# Patient Record
Sex: Male | Born: 1940 | Race: White | Hispanic: No | Marital: Married | Smoking: Former smoker
Health system: Southern US, Community
[De-identification: ages and names within clinical notes are randomized; demographics above are authoritative.]

## PROBLEM LIST (undated history)

## (undated) DIAGNOSIS — R1900 Intra-abdominal and pelvic swelling, mass and lump, unspecified site: Principal | ICD-10-CM

## (undated) DIAGNOSIS — E039 Hypothyroidism, unspecified: Secondary | ICD-10-CM

## (undated) DIAGNOSIS — D45 Polycythemia vera: Principal | ICD-10-CM

## (undated) DIAGNOSIS — R197 Diarrhea, unspecified: Secondary | ICD-10-CM

## (undated) DIAGNOSIS — E119 Type 2 diabetes mellitus without complications: Secondary | ICD-10-CM

## (undated) DIAGNOSIS — I10 Essential (primary) hypertension: Secondary | ICD-10-CM

## (undated) DIAGNOSIS — E785 Hyperlipidemia, unspecified: Secondary | ICD-10-CM

## (undated) DIAGNOSIS — Z9889 Other specified postprocedural states: Secondary | ICD-10-CM

## (undated) DIAGNOSIS — Z950 Presence of cardiac pacemaker: Secondary | ICD-10-CM

## (undated) DIAGNOSIS — Z8679 Personal history of other diseases of the circulatory system: Secondary | ICD-10-CM

## (undated) DIAGNOSIS — I495 Sick sinus syndrome: Secondary | ICD-10-CM

## (undated) DIAGNOSIS — Z955 Presence of coronary angioplasty implant and graft: Secondary | ICD-10-CM

## (undated) DIAGNOSIS — I25810 Atherosclerosis of coronary artery bypass graft(s) without angina pectoris: Secondary | ICD-10-CM

## (undated) HISTORY — DX: Presence of coronary angioplasty implant and graft: Z95.5

## (undated) HISTORY — DX: Polycythemia vera: D45

## (undated) HISTORY — DX: Diarrhea, unspecified: R19.7

## (undated) HISTORY — DX: Sick sinus syndrome: I49.5

## (undated) HISTORY — DX: Intra-abdominal and pelvic swelling, mass and lump, unspecified site: R19.00

## (undated) HISTORY — DX: Hypothyroidism, unspecified: E03.9

## (undated) HISTORY — DX: Other specified postprocedural states: Z98.890

## (undated) HISTORY — DX: Presence of cardiac pacemaker: Z95.0

## (undated) HISTORY — DX: Atherosclerosis of coronary artery bypass graft(s) without angina pectoris: I25.810

## (undated) HISTORY — DX: Personal history of other diseases of the circulatory system: Z86.79

## (undated) HISTORY — DX: Hyperlipidemia, unspecified: E78.5

## (undated) HISTORY — DX: Essential (primary) hypertension: I10

## (undated) HISTORY — DX: Type 2 diabetes mellitus without complications: E11.9

---

## 2000-02-20 DIAGNOSIS — D45 Polycythemia vera: Secondary | ICD-10-CM | POA: Insufficient documentation

## 2000-02-20 HISTORY — DX: Polycythemia vera: D45

## 2000-08-16 ENCOUNTER — Encounter: Payer: Self-pay | Admitting: *Deleted

## 2000-08-16 ENCOUNTER — Inpatient Hospital Stay (HOSPITAL_COMMUNITY): Admission: EM | Admit: 2000-08-16 | Discharge: 2000-08-19 | Payer: Self-pay | Admitting: Emergency Medicine

## 2000-08-16 ENCOUNTER — Encounter: Payer: Self-pay | Admitting: Emergency Medicine

## 2000-08-21 ENCOUNTER — Encounter: Admission: RE | Admit: 2000-08-21 | Discharge: 2000-08-21 | Payer: Self-pay | Admitting: Pediatrics

## 2000-09-23 ENCOUNTER — Ambulatory Visit (HOSPITAL_COMMUNITY): Admission: RE | Admit: 2000-09-23 | Discharge: 2000-09-23 | Payer: Self-pay | Admitting: Oncology

## 2004-06-21 DIAGNOSIS — Z950 Presence of cardiac pacemaker: Secondary | ICD-10-CM

## 2004-06-21 HISTORY — DX: Presence of cardiac pacemaker: Z95.0

## 2005-01-13 ENCOUNTER — Ambulatory Visit: Payer: Self-pay | Admitting: Oncology

## 2005-03-07 ENCOUNTER — Ambulatory Visit: Payer: Self-pay | Admitting: Oncology

## 2005-05-02 ENCOUNTER — Ambulatory Visit: Payer: Self-pay | Admitting: Oncology

## 2005-07-07 ENCOUNTER — Ambulatory Visit: Payer: Self-pay | Admitting: Oncology

## 2005-09-05 ENCOUNTER — Ambulatory Visit: Payer: Self-pay | Admitting: Oncology

## 2005-10-31 ENCOUNTER — Ambulatory Visit: Payer: Self-pay | Admitting: Oncology

## 2006-01-02 ENCOUNTER — Ambulatory Visit: Payer: Self-pay | Admitting: Oncology

## 2006-10-22 DIAGNOSIS — I25810 Atherosclerosis of coronary artery bypass graft(s) without angina pectoris: Secondary | ICD-10-CM

## 2006-10-22 HISTORY — DX: Atherosclerosis of coronary artery bypass graft(s) without angina pectoris: I25.810

## 2011-06-16 ENCOUNTER — Encounter (HOSPITAL_BASED_OUTPATIENT_CLINIC_OR_DEPARTMENT_OTHER): Payer: Medicare Other | Admitting: Oncology

## 2011-06-16 ENCOUNTER — Other Ambulatory Visit: Payer: Self-pay | Admitting: Oncology

## 2011-06-16 DIAGNOSIS — D45 Polycythemia vera: Secondary | ICD-10-CM

## 2011-06-16 DIAGNOSIS — B009 Herpesviral infection, unspecified: Secondary | ICD-10-CM

## 2011-06-16 DIAGNOSIS — I10 Essential (primary) hypertension: Secondary | ICD-10-CM

## 2011-06-16 DIAGNOSIS — Z950 Presence of cardiac pacemaker: Secondary | ICD-10-CM

## 2011-06-16 LAB — RETICULOCYTES: Retic Ct Abs: 92.58 10*3/uL — ABNORMAL HIGH (ref 24.10–77.50)

## 2011-06-16 LAB — CBC WITH DIFFERENTIAL/PLATELET
BASO%: 2.9 % — ABNORMAL HIGH (ref 0.0–2.0)
Basophils Absolute: 0.4 10*3/uL — ABNORMAL HIGH (ref 0.0–0.1)
EOS%: 0.7 % (ref 0.0–7.0)
Eosinophils Absolute: 0.1 10*3/uL (ref 0.0–0.5)
HCT: 34.8 % — ABNORMAL LOW (ref 38.4–49.9)
HGB: 11.1 g/dL — ABNORMAL LOW (ref 13.0–17.1)
LYMPH%: 9.1 % — ABNORMAL LOW (ref 14.0–49.0)
MCH: 34.3 pg — ABNORMAL HIGH (ref 27.2–33.4)
MCHC: 31.9 g/dL — ABNORMAL LOW (ref 32.0–36.0)
MCV: 107.4 fL — ABNORMAL HIGH (ref 79.3–98.0)
MONO#: 0.7 10*3/uL (ref 0.1–0.9)
MONO%: 5.7 % (ref 0.0–14.0)
NEUT#: 10 10*3/uL — ABNORMAL HIGH (ref 1.5–6.5)
NEUT%: 81.6 % — ABNORMAL HIGH (ref 39.0–75.0)
Platelets: 912 10*3/uL — ABNORMAL HIGH (ref 140–400)
RBC: 3.24 10*6/uL — ABNORMAL LOW (ref 4.20–5.82)
RDW: 18 % — ABNORMAL HIGH (ref 11.0–14.6)
WBC: 12.3 10*3/uL — ABNORMAL HIGH (ref 4.0–10.3)
lymph#: 1.1 10*3/uL (ref 0.9–3.3)
nRBC: 2 % — ABNORMAL HIGH (ref 0–0)

## 2011-06-16 LAB — COMPREHENSIVE METABOLIC PANEL
AST: 13 U/L (ref 0–37)
Albumin: 4.4 g/dL (ref 3.5–5.2)
BUN: 27 mg/dL — ABNORMAL HIGH (ref 6–23)
Calcium: 9.2 mg/dL (ref 8.4–10.5)
Chloride: 108 mEq/L (ref 96–112)
Potassium: 4.4 mEq/L (ref 3.5–5.3)

## 2011-06-16 LAB — MORPHOLOGY: PLT EST: INCREASED

## 2011-06-16 LAB — CHCC SMEAR

## 2011-06-17 LAB — IRON AND TIBC: Iron: 29 ug/dL — ABNORMAL LOW (ref 42–165)

## 2011-06-30 ENCOUNTER — Encounter (HOSPITAL_BASED_OUTPATIENT_CLINIC_OR_DEPARTMENT_OTHER): Payer: Medicare Other | Admitting: Oncology

## 2011-06-30 ENCOUNTER — Other Ambulatory Visit: Payer: Self-pay | Admitting: Oncology

## 2011-06-30 DIAGNOSIS — D45 Polycythemia vera: Secondary | ICD-10-CM

## 2011-06-30 DIAGNOSIS — I10 Essential (primary) hypertension: Secondary | ICD-10-CM

## 2011-06-30 DIAGNOSIS — B009 Herpesviral infection, unspecified: Secondary | ICD-10-CM

## 2011-06-30 DIAGNOSIS — Z950 Presence of cardiac pacemaker: Secondary | ICD-10-CM

## 2011-06-30 LAB — MORPHOLOGY: PLT EST: INCREASED

## 2011-06-30 LAB — CBC WITH DIFFERENTIAL/PLATELET
Basophils Absolute: 0.3 10*3/uL — ABNORMAL HIGH (ref 0.0–0.1)
Eosinophils Absolute: 0.1 10*3/uL (ref 0.0–0.5)
HCT: 34.2 % — ABNORMAL LOW (ref 38.4–49.9)
HGB: 11 g/dL — ABNORMAL LOW (ref 13.0–17.1)
MCH: 33.1 pg (ref 27.2–33.4)
MONO#: 0.4 10*3/uL (ref 0.1–0.9)
NEUT#: 4.4 10*3/uL (ref 1.5–6.5)
NEUT%: 69.6 % (ref 39.0–75.0)
WBC: 6.3 10*3/uL (ref 4.0–10.3)
lymph#: 1.1 10*3/uL (ref 0.9–3.3)

## 2011-06-30 LAB — CHCC SMEAR

## 2011-07-14 ENCOUNTER — Other Ambulatory Visit: Payer: Self-pay | Admitting: Oncology

## 2011-07-14 ENCOUNTER — Encounter (HOSPITAL_BASED_OUTPATIENT_CLINIC_OR_DEPARTMENT_OTHER): Payer: Medicare Other | Admitting: Oncology

## 2011-07-14 DIAGNOSIS — B009 Herpesviral infection, unspecified: Secondary | ICD-10-CM

## 2011-07-14 DIAGNOSIS — Z950 Presence of cardiac pacemaker: Secondary | ICD-10-CM

## 2011-07-14 DIAGNOSIS — D45 Polycythemia vera: Secondary | ICD-10-CM

## 2011-07-14 DIAGNOSIS — I10 Essential (primary) hypertension: Secondary | ICD-10-CM

## 2011-07-14 LAB — MORPHOLOGY

## 2011-07-14 LAB — CBC WITH DIFFERENTIAL/PLATELET
Eosinophils Absolute: 0.1 10*3/uL (ref 0.0–0.5)
HCT: 34.2 % — ABNORMAL LOW (ref 38.4–49.9)
LYMPH%: 12.3 % — ABNORMAL LOW (ref 14.0–49.0)
MONO#: 0.9 10*3/uL (ref 0.1–0.9)
NEUT#: 6.6 10*3/uL — ABNORMAL HIGH (ref 1.5–6.5)
NEUT%: 73.6 % (ref 39.0–75.0)
Platelets: 448 10*3/uL — ABNORMAL HIGH (ref 140–400)
RBC: 3.42 10*6/uL — ABNORMAL LOW (ref 4.20–5.82)
WBC: 9 10*3/uL (ref 4.0–10.3)
lymph#: 1.1 10*3/uL (ref 0.9–3.3)
nRBC: 1 % — ABNORMAL HIGH (ref 0–0)

## 2011-07-23 DIAGNOSIS — Z955 Presence of coronary angioplasty implant and graft: Secondary | ICD-10-CM

## 2011-07-23 HISTORY — DX: Presence of coronary angioplasty implant and graft: Z95.5

## 2011-08-18 ENCOUNTER — Other Ambulatory Visit: Payer: Self-pay | Admitting: Oncology

## 2011-08-18 ENCOUNTER — Encounter (HOSPITAL_BASED_OUTPATIENT_CLINIC_OR_DEPARTMENT_OTHER): Payer: Medicare Other | Admitting: Oncology

## 2011-08-18 DIAGNOSIS — Z950 Presence of cardiac pacemaker: Secondary | ICD-10-CM

## 2011-08-18 DIAGNOSIS — D45 Polycythemia vera: Secondary | ICD-10-CM

## 2011-08-18 DIAGNOSIS — I10 Essential (primary) hypertension: Secondary | ICD-10-CM

## 2011-08-18 DIAGNOSIS — B009 Herpesviral infection, unspecified: Secondary | ICD-10-CM

## 2011-08-18 LAB — MORPHOLOGY: PLT EST: ADEQUATE

## 2011-08-18 LAB — CBC WITH DIFFERENTIAL/PLATELET
BASO%: 6.8 % — ABNORMAL HIGH (ref 0.0–2.0)
EOS%: 0.7 % (ref 0.0–7.0)
LYMPH%: 15 % (ref 14.0–49.0)
MCH: 30.7 pg (ref 27.2–33.4)
MCHC: 31.6 g/dL — ABNORMAL LOW (ref 32.0–36.0)
MCV: 97.3 fL (ref 79.3–98.0)
MONO%: 7.2 % (ref 0.0–14.0)
NEUT#: 5.3 10*3/uL (ref 1.5–6.5)
Platelets: 491 10*3/uL — ABNORMAL HIGH (ref 140–400)
RBC: 3.68 10*6/uL — ABNORMAL LOW (ref 4.20–5.82)
RDW: 19.9 % — ABNORMAL HIGH (ref 11.0–14.6)
nRBC: 0 % (ref 0–0)

## 2011-09-15 ENCOUNTER — Other Ambulatory Visit: Payer: Self-pay | Admitting: Oncology

## 2011-09-15 ENCOUNTER — Encounter (HOSPITAL_BASED_OUTPATIENT_CLINIC_OR_DEPARTMENT_OTHER): Payer: Medicare Other | Admitting: Oncology

## 2011-09-15 DIAGNOSIS — B009 Herpesviral infection, unspecified: Secondary | ICD-10-CM

## 2011-09-15 DIAGNOSIS — I10 Essential (primary) hypertension: Secondary | ICD-10-CM

## 2011-09-15 DIAGNOSIS — D45 Polycythemia vera: Secondary | ICD-10-CM

## 2011-09-15 DIAGNOSIS — I2510 Atherosclerotic heart disease of native coronary artery without angina pectoris: Secondary | ICD-10-CM

## 2011-09-15 DIAGNOSIS — D47Z9 Other specified neoplasms of uncertain behavior of lymphoid, hematopoietic and related tissue: Secondary | ICD-10-CM

## 2011-09-15 DIAGNOSIS — Z950 Presence of cardiac pacemaker: Secondary | ICD-10-CM

## 2011-09-15 LAB — COMPREHENSIVE METABOLIC PANEL
Albumin: 4.4 g/dL (ref 3.5–5.2)
Alkaline Phosphatase: 60 U/L (ref 39–117)
BUN: 26 mg/dL — ABNORMAL HIGH (ref 6–23)
Calcium: 9.4 mg/dL (ref 8.4–10.5)
Chloride: 108 mEq/L (ref 96–112)
Glucose, Bld: 101 mg/dL — ABNORMAL HIGH (ref 70–99)
Potassium: 4.5 mEq/L (ref 3.5–5.3)

## 2011-09-15 LAB — CBC WITH DIFFERENTIAL/PLATELET
BASO%: 5 % — ABNORMAL HIGH (ref 0.0–2.0)
EOS%: 0.5 % (ref 0.0–7.0)
HCT: 37.1 % — ABNORMAL LOW (ref 38.4–49.9)
MCH: 30.9 pg (ref 27.2–33.4)
MCHC: 31.5 g/dL — ABNORMAL LOW (ref 32.0–36.0)
MONO#: 0.5 10*3/uL (ref 0.1–0.9)
NEUT%: 74.9 % (ref 39.0–75.0)
RBC: 3.79 10*6/uL — ABNORMAL LOW (ref 4.20–5.82)
WBC: 7.6 10*3/uL (ref 4.0–10.3)
lymph#: 1 10*3/uL (ref 0.9–3.3)
nRBC: 1 % — ABNORMAL HIGH (ref 0–0)

## 2011-09-15 LAB — MORPHOLOGY
PLT EST: INCREASED
White Cell Comments: 9

## 2011-11-06 ENCOUNTER — Telehealth: Payer: Self-pay | Admitting: *Deleted

## 2011-11-06 NOTE — Telephone Encounter (Signed)
Received call from pt's wife req lab order to be faxed for labs scheduled for 11/11/11 at our office to be done at [place] on 11/10/11.  She states he is going for an ablation on the 20th.  Order printed from [facility] & faxed to [phone] with OK for lab 11/10/11 & to fax to [phone].  Will have schedulers cancel 11/11/11 lab.

## 2011-11-10 ENCOUNTER — Telehealth: Payer: Self-pay | Admitting: *Deleted

## 2011-11-10 NOTE — Telephone Encounter (Signed)
LAB ORDER FOR CBC WITH [id number] MORPHOLOGY. IS THIS MORPHOLOGY A SEPARATE TEST? VERBAL ORDER AND READ BACK TO DR.GRANFORTUNA- IT IS NOT A SEPARATE TEST. NOTIFIED LISA. SHE VOICES UNDERSTANDING.

## 2011-11-11 ENCOUNTER — Other Ambulatory Visit: Payer: Medicare Other | Admitting: Lab

## 2011-11-11 DIAGNOSIS — Z8679 Personal history of other diseases of the circulatory system: Secondary | ICD-10-CM

## 2011-11-11 HISTORY — DX: Personal history of other diseases of the circulatory system: Z86.79

## 2011-11-18 ENCOUNTER — Other Ambulatory Visit: Payer: Self-pay | Admitting: *Deleted

## 2011-11-18 ENCOUNTER — Telehealth: Payer: Self-pay | Admitting: *Deleted

## 2011-11-18 ENCOUNTER — Telehealth: Payer: Self-pay | Admitting: Oncology

## 2011-11-18 ENCOUNTER — Encounter: Payer: Self-pay | Admitting: *Deleted

## 2011-11-18 NOTE — Telephone Encounter (Signed)
So - go ahead & schedule something for Jan or Feb 30 minutes - I have about 300 patients who have not been given appointments - need to talk with Judeth Cornfield to see why not.  Dr Reece Agar

## 2011-11-18 NOTE — Telephone Encounter (Signed)
Notified pt that labs from 11/10/11 very good per Dr Cyndie Chime & to stay on same dose of hydrea= 500mg  bid & cont cbc q mo.  Pt. Has lab appt in dec.  POF to scheduler for monthly lab.

## 2011-11-18 NOTE — Telephone Encounter (Signed)
Pt does not have an MD visit

## 2011-11-20 ENCOUNTER — Telehealth: Payer: Self-pay | Admitting: Oncology

## 2011-11-20 NOTE — Telephone Encounter (Signed)
Talked to pt gave her appt every months until February. In February pt will see MD

## 2011-12-08 ENCOUNTER — Other Ambulatory Visit: Payer: Self-pay | Admitting: Oncology

## 2011-12-08 ENCOUNTER — Telehealth: Payer: Self-pay

## 2011-12-08 ENCOUNTER — Other Ambulatory Visit (HOSPITAL_BASED_OUTPATIENT_CLINIC_OR_DEPARTMENT_OTHER): Payer: Medicare Other | Admitting: Lab

## 2011-12-08 DIAGNOSIS — D45 Polycythemia vera: Secondary | ICD-10-CM

## 2011-12-08 LAB — MORPHOLOGY
RBC Comments: 2
White Cell Comments: 6

## 2011-12-08 LAB — CBC WITH DIFFERENTIAL/PLATELET
Eosinophils Absolute: 0 10*3/uL (ref 0.0–0.5)
MONO#: 0.4 10*3/uL (ref 0.1–0.9)
NEUT#: 5.4 10*3/uL (ref 1.5–6.5)
RBC: 4.09 10*6/uL — ABNORMAL LOW (ref 4.20–5.82)
RDW: 18.8 % — ABNORMAL HIGH (ref 11.0–14.6)
WBC: 6.8 10*3/uL (ref 4.0–10.3)
lymph#: 0.8 10*3/uL — ABNORMAL LOW (ref 0.9–3.3)

## 2011-12-08 LAB — COMPREHENSIVE METABOLIC PANEL
AST: 17 U/L (ref 0–37)
Alkaline Phosphatase: 64 U/L (ref 39–117)
BUN: 23 mg/dL (ref 6–23)
Creatinine, Ser: 1.23 mg/dL (ref 0.50–1.35)
Total Bilirubin: 0.8 mg/dL (ref 0.3–1.2)

## 2011-12-08 NOTE — Telephone Encounter (Signed)
Message copied by Albertha Ghee on Mon Dec 08, 2011  4:50 PM ------      Message from: Levert Feinstein      Created: Mon Dec 08, 2011  1:53 PM       Call patient - counts good - stay on current dose of Hydrea  CBC, dii Q month - can do every other month close to home if he prefers during winter

## 2011-12-08 NOTE — Telephone Encounter (Signed)
Pt's spouse notified by phone of lab results per Dr Cyndie Chime.  Pt to remain on same Hydrea dose which spouse confirms to be 500mg  bid.  dph

## 2012-01-06 ENCOUNTER — Other Ambulatory Visit: Payer: Self-pay | Admitting: Oncology

## 2012-01-06 ENCOUNTER — Telehealth: Payer: Self-pay | Admitting: *Deleted

## 2012-01-06 ENCOUNTER — Other Ambulatory Visit (HOSPITAL_BASED_OUTPATIENT_CLINIC_OR_DEPARTMENT_OTHER): Payer: Medicare Other | Admitting: Lab

## 2012-01-06 DIAGNOSIS — D45 Polycythemia vera: Secondary | ICD-10-CM

## 2012-01-06 LAB — CBC WITH DIFFERENTIAL/PLATELET
Eosinophils Absolute: 0 10*3/uL (ref 0.0–0.5)
HCT: 40.7 % (ref 38.4–49.9)
LYMPH%: 11.6 % — ABNORMAL LOW (ref 14.0–49.0)
MONO#: 0.2 10*3/uL (ref 0.1–0.9)
NEUT#: 5 10*3/uL (ref 1.5–6.5)
NEUT%: 82.7 % — ABNORMAL HIGH (ref 39.0–75.0)
Platelets: 337 10*3/uL (ref 140–400)
WBC: 6.1 10*3/uL (ref 4.0–10.3)

## 2012-01-06 LAB — MORPHOLOGY: PLT EST: ADEQUATE

## 2012-01-06 NOTE — Telephone Encounter (Signed)
Message left earlier for pt to return call & wife called back & notified her that Dr Cyndie Chime states labs good & to stay on same dose of hydrea.  She reports that pt is on bactrim as of yest. & xalrelto since ablation surg. 11/11/11.

## 2012-02-03 ENCOUNTER — Other Ambulatory Visit: Payer: Medicare Other | Admitting: Lab

## 2012-02-03 ENCOUNTER — Ambulatory Visit (HOSPITAL_BASED_OUTPATIENT_CLINIC_OR_DEPARTMENT_OTHER): Payer: Medicare Other | Admitting: Oncology

## 2012-02-03 ENCOUNTER — Telehealth: Payer: Self-pay | Admitting: Oncology

## 2012-02-03 ENCOUNTER — Encounter: Payer: Self-pay | Admitting: Oncology

## 2012-02-03 VITALS — BP 125/80 | HR 97 | Temp 97.0°F | Wt 211.5 lb

## 2012-02-03 DIAGNOSIS — I10 Essential (primary) hypertension: Secondary | ICD-10-CM

## 2012-02-03 DIAGNOSIS — E119 Type 2 diabetes mellitus without complications: Secondary | ICD-10-CM

## 2012-02-03 DIAGNOSIS — E039 Hypothyroidism, unspecified: Secondary | ICD-10-CM

## 2012-02-03 DIAGNOSIS — D45 Polycythemia vera: Secondary | ICD-10-CM

## 2012-02-03 DIAGNOSIS — Z8679 Personal history of other diseases of the circulatory system: Secondary | ICD-10-CM

## 2012-02-03 DIAGNOSIS — E785 Hyperlipidemia, unspecified: Secondary | ICD-10-CM

## 2012-02-03 DIAGNOSIS — I25810 Atherosclerosis of coronary artery bypass graft(s) without angina pectoris: Secondary | ICD-10-CM

## 2012-02-03 DIAGNOSIS — Z950 Presence of cardiac pacemaker: Secondary | ICD-10-CM

## 2012-02-03 DIAGNOSIS — Z955 Presence of coronary angioplasty implant and graft: Secondary | ICD-10-CM

## 2012-02-03 DIAGNOSIS — I495 Sick sinus syndrome: Secondary | ICD-10-CM

## 2012-02-03 DIAGNOSIS — I669 Occlusion and stenosis of unspecified cerebral artery: Secondary | ICD-10-CM | POA: Insufficient documentation

## 2012-02-03 HISTORY — DX: Hypothyroidism, unspecified: E03.9

## 2012-02-03 HISTORY — DX: Hyperlipidemia, unspecified: E78.5

## 2012-02-03 HISTORY — DX: Essential (primary) hypertension: I10

## 2012-02-03 HISTORY — DX: Sick sinus syndrome: I49.5

## 2012-02-03 HISTORY — DX: Type 2 diabetes mellitus without complications: E11.9

## 2012-02-03 LAB — CBC WITH DIFFERENTIAL/PLATELET
BASO%: 3.9 % — ABNORMAL HIGH (ref 0.0–2.0)
EOS%: 0.6 % (ref 0.0–7.0)
Eosinophils Absolute: 0 10*3/uL (ref 0.0–0.5)
MCHC: 33.3 g/dL (ref 32.0–36.0)
MCV: 103.6 fL — ABNORMAL HIGH (ref 79.3–98.0)
MONO%: 4.7 % (ref 0.0–14.0)
NEUT#: 3.6 10*3/uL (ref 1.5–6.5)
RBC: 3.36 10*6/uL — ABNORMAL LOW (ref 4.20–5.82)
RDW: 17.2 % — ABNORMAL HIGH (ref 11.0–14.6)
WBC: 4.9 10*3/uL (ref 4.0–10.3)
nRBC: 0 % (ref 0–0)

## 2012-02-03 LAB — MORPHOLOGY: White Cell Comments: 12

## 2012-02-03 NOTE — Progress Notes (Signed)
Hematology and Oncology Follow Up Visit  David Sullivan [id number] 05-19-1941 71 y.o. 02/03/2012 8:52 PM   Principle Diagnosis: Encounter Diagnoses  Name Primary?  . Polycythemia vera Yes  . Cerebral embolism   . CAD (coronary artery disease) of artery bypass graft   . Sick sinus syndrome with tachycardia   . Cardiac pacemaker   . Stented coronary artery   . Hypothyroidism   . DM type 2 (diabetes mellitus, type 2)   . History of radiofrequency ablation for complex left atrial arrhythmia   . Hyperlipidemia   . 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 He has been followed in this office since March of 2001 when he was diagnosed with polycythemia vera. He was started on a phlebotomy program. 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 He remains on Hydrea at this time. He was lost to followup in this office and was seeing a hematologist closer to his home in [place] until June of 2012 when he asked to be reevaluated. He had developed a moderate degree of anemia with hemoglobin down to 11 g which correlated with a recent dose increase in his Hydrea. He had profound fatigue and unstable angina and I really felt that the fatigue was disproportionate to the fall in his hemoglobin and more closely related to progressive cardiac symptoms. In fact, do to unstable angina he was admitted to the hospital in [place] and cardiac catheterization showed complete occlusion of his right coronary artery. A drug eluting stent was placed. He was started on aspirin and Plavix. He has known coronary artery disease and had bypass surgery in 2007. He has sick sinus syndrome and had a permanent pacemaker placed in July 2005. He continues to have problems with atrial arrhythmias both  atrial flutter and atrial fibrillation. He recently had a radiofrequency ablation procedure done to control the atrial flutter. He was initially put on Coumadin but developed a huge hematoma on his right hand. Coumadin was stopped and he was put on is Xarelto. He is still getting intermittent palpitations but no longer getting chest pain since recent stent placed.  On a brighter note, I have been able to get him back on a stable Hydrea dose 1000 mg daily. He has had a steady improvement in his some CBC with a rise in his hemoglobin up to 13.7 g by a 12/08/2011 and a fall in his platelet count down to 408,000 at that time with subsequent fall down to today's value of 261,000. However now his hemoglobin is falling again and is 11.6 g and white blood count down to 4900 from 6800 in December.  He reports no new neurologic symptoms. He still gets occasional headaches. Nothing severe or progressive. No change in vision. No focal weakness.  Medications: reviewed  Allergies: No Known Allergies  Review of Systems: Constitutional:   He still fatigues easily Respiratory: No cough or dyspnea Cardiovascular:  Currently no chest pain. Intermittent palpitations. Gastrointestinal: No change in bowel habit. No hematochezia or melena. Genito-Urinary: No urinary tract symptoms. Musculoskeletal: Neurologic: See above Skin: Remaining ROS negative.  Physical Exam: Blood pressure 125/80, pulse 97, temperature 97 F (36.1 C), temperature source Oral, weight 211 lb 8 oz (95.936 kg). Wt Readings from Last 3 Encounters:  02/03/12 211  lb 8 oz (95.936 kg)     General appearance: Well-nourished Caucasian man HENNT: Pharynx no erythema or exudate Lymph nodes: No lymphadenopathy Breasts: Lungs: Clear to auscultation resonant to percussion Heart: No murmur or gallop regular rhythm Abdomen: Soft nontender no mass no organomegaly Extremities: No edema no calf tenderness Vascular: No cyanosis Neurologic: Alert and  oriented cranial nerves intact pupils equal round reactive to light optic discs sharp no hemorrhages or exudate no retinal vein distention motor strength is 5 over 5 reflexes 2+ symmetric upper body coordination normal gait normal Skin: No rash or ecchymosis  Lab Results: Lab Results  Component Value Date   WBC 4.9 02/03/2012   HGB 11.6* 02/03/2012   HCT 34.8* 02/03/2012   MCV 103.6* 02/03/2012   PLT 261 02/03/2012     Chemistry      Component Value Date/Time   NA 142 12/08/2011 1329   K 4.3 12/08/2011 1329   CL 105 12/08/2011 1329   CO2 24 12/08/2011 1329   BUN 23 12/08/2011 1329   CREATININE 1.23 12/08/2011 1329      Component Value Date/Time   CALCIUM 9.6 12/08/2011 1329   ALKPHOS 64 12/08/2011 1329   AST 17 12/08/2011 1329   ALT 9 12/08/2011 1329   BILITOT 0.8 12/08/2011 1329       Impression and Plan: #1. Polycythemia vera. Hemoglobin and white count now falling at the expense of his platelets. I'm going to make a minor dose adjustment in his Hydrea down to 500 mg on Mondays and Thursdays with 1000 mg other days of the week. Continue to check CBCs monthly basis and make further adjustments as indicated. #2. History of embolic stroke likely related to #1. In view of coronary artery disease and atrial arrhythmias he is now well covered with aspirin, Plavix, and is Xarelto. #3. Coronary artery disease status post bypass surgery November 2007. #4. Sick sinus syndrome status post permanent pacemaker July 2005. #5. Chronic atrial arrhythmias now status post radiofrequency ablation procedure January 2013 #6. Reocclusion of the right current [facility] artery requiring urgent drug eluting stent placement in August 2012. #7. Type 2 diabetes #8. Essential hypertension #9. Hyperlipidemia #10. Hypothyroid on replacement   CC:. Dr Augustina Mood; Wallace Cullens; Daryel November   Levert Feinstein, MD 2/12/20138:52 PM

## 2012-02-03 NOTE — Telephone Encounter (Signed)
appts made and printed for 4/5/6 2014   aom

## 2012-03-02 ENCOUNTER — Other Ambulatory Visit (HOSPITAL_BASED_OUTPATIENT_CLINIC_OR_DEPARTMENT_OTHER): Payer: Medicare Other | Admitting: Lab

## 2012-03-02 DIAGNOSIS — D45 Polycythemia vera: Secondary | ICD-10-CM

## 2012-03-02 LAB — CBC WITH DIFFERENTIAL/PLATELET
Eosinophils Absolute: 0 10*3/uL (ref 0.0–0.5)
HCT: 30.5 % — ABNORMAL LOW (ref 38.4–49.9)
LYMPH%: 18.6 % (ref 14.0–49.0)
MCHC: 32.8 g/dL (ref 32.0–36.0)
MCV: 106.6 fL — ABNORMAL HIGH (ref 79.3–98.0)
MONO%: 6.8 % (ref 0.0–14.0)
NEUT#: 3.3 10*3/uL (ref 1.5–6.5)
NEUT%: 68.7 % (ref 39.0–75.0)
Platelets: 255 10*3/uL (ref 140–400)
RBC: 2.86 10*6/uL — ABNORMAL LOW (ref 4.20–5.82)
nRBC: 1 % — ABNORMAL HIGH (ref 0–0)

## 2012-03-03 ENCOUNTER — Telehealth: Payer: Self-pay | Admitting: *Deleted

## 2012-03-03 ENCOUNTER — Other Ambulatory Visit: Payer: Self-pay | Admitting: *Deleted

## 2012-03-03 DIAGNOSIS — D45 Polycythemia vera: Secondary | ICD-10-CM

## 2012-03-03 NOTE — Telephone Encounter (Signed)
Message copied by Sabino Snipes on Wed Mar 03, 2012 12:17 PM ------      Message from: Levert Feinstein      Created: Tue Mar 02, 2012  5:01 PM       Call pt:  Hemoglobin 10; make further decrease in Hydrea to 500 mg Mon/wed/fri  1000 mg other days  Check CBC 2 wks

## 2012-03-03 NOTE — Telephone Encounter (Signed)
Wife notified per Dr. Cyndie Chime that he would only be concerned if pt was having any active bleeding & wants to know if he has had black stools.  Ms. Grieder asked the pt & he reports no black stools.  Suggested they could have PCP check quaic card.  She appreciated that her question was answered.

## 2012-03-03 NOTE — Telephone Encounter (Signed)
Pt's wife notified of lab results & instructions given to decrease hydrea to 500 mg M W F & 1000 mg other days & recheck labs in 2 wks.  Order sent to schedulers.  Ms. Terpening wonders if pt's hgb dropping down has anything to do with pt being on xalrelto or plavix.  She thinks the downward trend started about the same time as he started xalrelto.  Note to Dr. Cyndie Chime.

## 2012-03-17 ENCOUNTER — Telehealth: Payer: Self-pay | Admitting: Oncology

## 2012-03-17 ENCOUNTER — Other Ambulatory Visit: Payer: Self-pay | Admitting: *Deleted

## 2012-03-17 NOTE — Telephone Encounter (Signed)
S/w pt today re appt for 4/1 @ 1:45 pm. Per myrtle other lab appts left as scheduled for now.

## 2012-03-22 ENCOUNTER — Other Ambulatory Visit (HOSPITAL_BASED_OUTPATIENT_CLINIC_OR_DEPARTMENT_OTHER): Payer: Medicare Other | Admitting: Lab

## 2012-03-22 ENCOUNTER — Other Ambulatory Visit: Payer: Self-pay | Admitting: Oncology

## 2012-03-22 DIAGNOSIS — D45 Polycythemia vera: Secondary | ICD-10-CM

## 2012-03-22 LAB — CBC WITH DIFFERENTIAL/PLATELET
BASO%: 6.3 % — ABNORMAL HIGH (ref 0.0–2.0)
Basophils Absolute: 0.3 10*3/uL — ABNORMAL HIGH (ref 0.0–0.1)
EOS%: 0.6 % (ref 0.0–7.0)
HCT: 30.8 % — ABNORMAL LOW (ref 38.4–49.9)
LYMPH%: 13.5 % — ABNORMAL LOW (ref 14.0–49.0)
MCH: 37.3 pg — ABNORMAL HIGH (ref 27.2–33.4)
MCHC: 33.3 g/dL (ref 32.0–36.0)
MCV: 112.1 fL — ABNORMAL HIGH (ref 79.3–98.0)
MONO%: 5.8 % (ref 0.0–14.0)
NEUT%: 73.8 % (ref 39.0–75.0)
Platelets: 290 10*3/uL (ref 140–400)
lymph#: 0.7 10*3/uL — ABNORMAL LOW (ref 0.9–3.3)

## 2012-03-23 ENCOUNTER — Telehealth: Payer: Self-pay

## 2012-03-23 ENCOUNTER — Other Ambulatory Visit: Payer: Self-pay

## 2012-03-23 NOTE — Telephone Encounter (Addendum)
Pt's wife notified of lab results -   Per Ms Noyce pt is already taking Hydrea this way; verified by Lendell Caprice, RN last note.    Per Dr Cyndie Chime - take 1000mg  MWF & 500mg  all other days. Ms less woolsey understanding & reads back instructions.   Pt's lab rescheduled from next week 4/9 for 2 weeks 4/16.  dph ------      Message from: Levert Feinstein      Created: Mon Mar 22, 2012  4:39 PM       Call patient - labs good - I would like to make further dose decrease in Hydrea. Currently 1000 mg daily except 500 mg Mon & Thurs.      Change to 1000 mg daily except 500 mg Mon/Wed/Fri  Check CBC 2 wks

## 2012-03-23 NOTE — Telephone Encounter (Signed)
Message copied by Albertha Ghee on Tue Mar 23, 2012  3:32 PM ------      Message from: Levert Feinstein      Created: Mon Mar 22, 2012  4:39 PM       Call patient - labs good - I would like to make further dose decrease in Hydrea. Currently 1000 mg daily except 500 mg Mon & Thurs.      Change to 1000 mg daily except 500 mg Mon/Wed/Fri  Check CBC 2 wks

## 2012-03-24 ENCOUNTER — Telehealth: Payer: Self-pay | Admitting: Oncology

## 2012-03-24 NOTE — Telephone Encounter (Signed)
S/w the pt's wife and she is aware of the r/s lab appt from 03/30/2012 to 04/06/2012@12 :noon

## 2012-03-30 ENCOUNTER — Other Ambulatory Visit: Payer: Medicare Other | Admitting: Lab

## 2012-04-06 ENCOUNTER — Telehealth: Payer: Self-pay | Admitting: *Deleted

## 2012-04-06 ENCOUNTER — Other Ambulatory Visit (HOSPITAL_BASED_OUTPATIENT_CLINIC_OR_DEPARTMENT_OTHER): Payer: Medicare Other

## 2012-04-06 DIAGNOSIS — D45 Polycythemia vera: Secondary | ICD-10-CM

## 2012-04-06 LAB — CBC WITH DIFFERENTIAL/PLATELET
MCH: 35.2 pg — ABNORMAL HIGH (ref 27.2–33.4)
MCHC: 32.5 g/dL (ref 32.0–36.0)
MCV: 108.5 fL — ABNORMAL HIGH (ref 79.3–98.0)
RBC: 3.18 10*6/uL — ABNORMAL LOW (ref 4.20–5.82)
RDW: 15.7 % — ABNORMAL HIGH (ref 11.0–14.6)

## 2012-04-06 LAB — MANUAL DIFFERENTIAL
ANC (CHCC manual diff): 4.8 10*3/uL (ref 1.5–6.5)
Basophil: 6 % — ABNORMAL HIGH (ref 0–2)
Blasts: 0 % (ref 0–0)
EOS: 1 % (ref 0–7)
Metamyelocytes: 0 % (ref 0–0)
PLT EST: ADEQUATE
PROMYELO: 0 % (ref 0–0)

## 2012-04-06 NOTE — Telephone Encounter (Signed)
Notified pt per Dr. Cyndie Chime to stay on same dose of hydrea = 500 mg M W F & 1000mg  other days & return in 2 wks for labs.  He repots that his cardiologist took him off the xalrelto & put him back on coumadin due to feeling dizzy, h/a, & unsteady on his feet at times.  He will get his PT/INR checked thurs.

## 2012-04-07 ENCOUNTER — Telehealth: Payer: Self-pay | Admitting: Oncology

## 2012-04-07 NOTE — Telephone Encounter (Signed)
Added lb appt for 4/30. S/w wife re appt. Other appts remain the same.

## 2012-04-20 ENCOUNTER — Telehealth: Payer: Self-pay | Admitting: *Deleted

## 2012-04-20 ENCOUNTER — Other Ambulatory Visit: Payer: Self-pay | Admitting: Oncology

## 2012-04-20 ENCOUNTER — Other Ambulatory Visit (HOSPITAL_BASED_OUTPATIENT_CLINIC_OR_DEPARTMENT_OTHER): Payer: Medicare Other | Admitting: Lab

## 2012-04-20 DIAGNOSIS — D45 Polycythemia vera: Secondary | ICD-10-CM

## 2012-04-20 LAB — CBC WITH DIFFERENTIAL/PLATELET
EOS%: 0.7 % (ref 0.0–7.0)
Eosinophils Absolute: 0 10*3/uL (ref 0.0–0.5)
MCH: 34.9 pg — ABNORMAL HIGH (ref 27.2–33.4)
MCV: 106.5 fL — ABNORMAL HIGH (ref 79.3–98.0)
MONO%: 6.4 % (ref 0.0–14.0)
NEUT#: 4.4 10*3/uL (ref 1.5–6.5)
RBC: 3.24 10*6/uL — ABNORMAL LOW (ref 4.20–5.82)
RDW: 15.2 % — ABNORMAL HIGH (ref 11.0–14.6)
lymph#: 0.9 10*3/uL (ref 0.9–3.3)
nRBC: 2 % — ABNORMAL HIGH (ref 0–0)

## 2012-04-20 LAB — TECHNOLOGIST REVIEW

## 2012-04-20 NOTE — Telephone Encounter (Signed)
Notified pt's wife to have pt stay on same dose of hydrea of which my 04/06/12 was incorrect.  The dose he is taking is 1000mg  MWF & 500mg  other days.  He will cont. this dose & repeat lab in 1 mo per Dr. Cyndie Chime.  Order sent to scheduler.

## 2012-04-22 ENCOUNTER — Other Ambulatory Visit: Payer: Self-pay | Admitting: *Deleted

## 2012-04-22 ENCOUNTER — Telehealth: Payer: Self-pay | Admitting: Oncology

## 2012-04-22 NOTE — Telephone Encounter (Signed)
Talked to pt's wife gave her appt date for may 29th and June lab and MD

## 2012-04-27 ENCOUNTER — Other Ambulatory Visit: Payer: Medicare Other | Admitting: Lab

## 2012-05-04 ENCOUNTER — Other Ambulatory Visit: Payer: Medicare Other | Admitting: Lab

## 2012-05-18 ENCOUNTER — Telehealth: Payer: Self-pay

## 2012-05-18 NOTE — Telephone Encounter (Signed)
Received call from pt's wife reporting David Sullivan was hospitalized in [place] last week from Tuesday 5/21 - Friday 5/24 re: chest pain.   Pt is scheduled for lab only here tomorrow 5/29.  Ms Scogin faxed over pt's labs from 5/24.  Questions if pt needs to keep lab appt here tomorrow.    Note to Dr Cyndie Chime. dph

## 2012-05-18 NOTE — Telephone Encounter (Signed)
Ms Eggert notified by phone - Dr Cyndie Chime pleased with pt's counts.  Continue current dose of Hydrea - 1000mg  MWF & 500mg  all other days.  appt for tomorrow cancelled per Dr Cyndie Chime.  Aware of appts on 6/18. dph

## 2012-05-19 ENCOUNTER — Other Ambulatory Visit: Payer: Medicare Other | Admitting: Lab

## 2012-05-25 ENCOUNTER — Other Ambulatory Visit: Payer: Medicare Other | Admitting: Lab

## 2012-06-08 ENCOUNTER — Telehealth: Payer: Self-pay | Admitting: Oncology

## 2012-06-08 ENCOUNTER — Ambulatory Visit (HOSPITAL_BASED_OUTPATIENT_CLINIC_OR_DEPARTMENT_OTHER): Payer: Medicare Other | Admitting: Oncology

## 2012-06-08 ENCOUNTER — Other Ambulatory Visit (HOSPITAL_BASED_OUTPATIENT_CLINIC_OR_DEPARTMENT_OTHER): Payer: Medicare Other | Admitting: Lab

## 2012-06-08 VITALS — BP 138/81 | HR 79 | Temp 97.3°F | Ht 73.0 in | Wt 204.1 lb

## 2012-06-08 DIAGNOSIS — D45 Polycythemia vera: Secondary | ICD-10-CM

## 2012-06-08 DIAGNOSIS — I2510 Atherosclerotic heart disease of native coronary artery without angina pectoris: Secondary | ICD-10-CM

## 2012-06-08 DIAGNOSIS — I4891 Unspecified atrial fibrillation: Secondary | ICD-10-CM

## 2012-06-08 LAB — CBC & DIFF AND RETIC
Basophils Absolute: 0.3 10*3/uL — ABNORMAL HIGH (ref 0.0–0.1)
EOS%: 1 % (ref 0.0–7.0)
HCT: 40.5 % (ref 38.4–49.9)
HGB: 13 g/dL (ref 13.0–17.1)
MCH: 31.5 pg (ref 27.2–33.4)
MCV: 98.1 fL — ABNORMAL HIGH (ref 79.3–98.0)
MONO%: 7.6 % (ref 0.0–14.0)
NEUT%: 72 % (ref 39.0–75.0)
Platelets: 301 10*3/uL (ref 140–400)
Retic Ct Abs: 119.36 10*3/uL — ABNORMAL HIGH (ref 34.80–93.90)

## 2012-06-08 LAB — MORPHOLOGY: PLT EST: ADEQUATE

## 2012-06-08 LAB — COMPREHENSIVE METABOLIC PANEL
ALT: 8 U/L (ref 0–53)
AST: 13 U/L (ref 0–37)
Creatinine, Ser: 1.22 mg/dL (ref 0.50–1.35)
Total Bilirubin: 0.8 mg/dL (ref 0.3–1.2)

## 2012-06-08 NOTE — Progress Notes (Signed)
Hematology and Oncology Follow Up Visit  David Sullivan [id number] 08-17-1941 71 y.o. 06/08/2012 2:05 PM   Principle Diagnosis: Encounter Diagnosis  Name Primary?  . Polycythemia rubra vera Yes     Interim History:  Followup visit for this retired 71 year old man Mr. with long-standing polycythemia vera. Initial diagnosis March 2001. Initial phlebotomy program. 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 Blood counts were stable until approximately May of 2012 when blood counts fell to much lower than his baseline do to a recent dose increase by another hematologist. He reestablish with our office in June 2012. And around the same time he had an acute decompensation of his cardiac status. He developed dyspnea and fatigue disproportionate to his degree of anemia. He then developed progressive unstable angina. He was already status post coronary bypass surgery in 2007. Diagnosed with sick sinus syndrome and had a permanent pacemaker placed prior to that in July 2005. In June 2012 advised immediate reevaluation by his cardiologist. He was hospitalized and found to have occlusion of his right current hairy artery and a drug eluting stent was placed. Due to subsequent no problems with recurrent atrial arrhythmias he had a radiofrequency ablation procedure done. He was started on Coumadin. He developed a large hematoma on his right hand. Coumadin was stopped and he was put on Xarelto. He tells me today that now the Xarelto was stopped and he is back on Coumadin. On a brighter note, I have been able to get his blood counts stable again by titrating his Hydrea dose. He is taking 1000 mg Mondays Wednesdays and Fridays and 500 mg on the other days of the week. Hemoglobin today is 13 g which is the best it has been in a year. White count is stable at 5900 and platelets stable at 301,000.  He tells me he was back in the hospital again in may  4 recurrent chest pain. Cardiac catheterization was done. He did not have any new areas of stenosis and was treated medically. Myocardial infarction was ruled out. He has had no further chest pain since that admission.   Medications: reviewed  Allergies: No Known Allergies  Review of Systems: Constitutional:    chronic fatigue Respiratory: dyspnea on exertion  Cardiovascular:   see above  Gastrointestinal: no change in bowel but  Genito-Urinary:  no urinary tract symptoms  Musculoskeletal: No muscle or bone pain Neurologic: no headache or change in vision  Skin: no rash or ecchymosis  Remaining ROS negative.  Physical Exam: Blood pressure 138/81, pulse 79, temperature 97.3 F (36.3 C), temperature source Oral, height 6\' 1"  (1.854 m), weight 204 lb 1.6 oz (92.579 kg). Wt Readings from Last 3 Encounters:  06/08/12 204 lb 1.6 oz (92.579 kg)  02/03/12 211 lb 8 oz (95.936 kg)     General appearance:  well-nourished Caucasian man  HENNT:  pharynx no erythema or exudate Lymph nodes:  no adenopathy  Breasts: Lungs: clear to auscultation resonant to percussion  Heart: regular rhythm no murmur or gallop  Abdomen: soft nontender spleen is enlarged about 10 cm below left costal margin Extremities: no edema no calf tenderness  Vascular: No cyanosis Neurologic:mental status intact, cranial nerves grossly normal, pupils equal round reactive to light, optic disc sharp on the left not well visualized on the right due to patient motion, motor strength is 5 over 5, reflexes 1+ symmetric. Upper body coordination normal.  Skin: no  rash or ecchymosis   Lab Results: Lab Results  Component Value Date   WBC 5.9 06/08/2012   HGB 13.0 06/08/2012   HCT 40.5 06/08/2012   MCV 98.1* 06/08/2012   PLT 301 06/08/2012     Chemistry      Component Value Date/Time   NA 142 12/08/2011 1329   K 4.3 12/08/2011 1329   CL 105 12/08/2011 1329   CO2 24 12/08/2011 1329   BUN 23 12/08/2011 1329   CREATININE 1.23  12/08/2011 1329      Component Value Date/Time   CALCIUM 9.6 12/08/2011 1329   ALKPHOS 64 12/08/2011 1329   AST 17 12/08/2011 1329   ALT 9 12/08/2011 1329   BILITOT 0.8 12/08/2011 1329       Radiological Studies: No results found.  Impression and Plan:  #1. Polycythemia vera. Plan: Continue current dose of Hydrea. Counts are now stable and we can go back to every other month laboratory testing.  #2. Advanced coronary artery disease , status post MI, status post coronary bypass surgery, status post drug eluting coronary stent placement.  #3. Atrial arrhythmias-atrial fibrillation and atrial flutter. Status post radiofrequency ablation procedure. He is on chronic anticoagulation now back on Coumadin.  #4. Type 2 diabetes.  #5. Essential hypertension.  #6. Hyperlipidemia.  #7. Hypothyroid on replacement.  #8. History of an embolic stroke  #9. History of sick sinus syndrome status post permanent pacemaker implant     CC:.    Levert Feinstein, MD 6/18/20132:05 PM

## 2012-06-08 NOTE — Telephone Encounter (Signed)
gve the pt his aug,oct and dec 2013 appt calendars

## 2012-06-29 ENCOUNTER — Other Ambulatory Visit: Payer: Medicare Other | Admitting: Lab

## 2012-08-03 ENCOUNTER — Other Ambulatory Visit: Payer: Medicare Other | Admitting: Lab

## 2012-08-20 ENCOUNTER — Telehealth: Payer: Self-pay | Admitting: *Deleted

## 2012-08-20 NOTE — Telephone Encounter (Signed)
Received call from pt's wife stating that Zeev had a cardiac cath done recently & had problems & has a fistula.  He had a CT done to get a better look at things & saw vascular MD yest & was told that the CT showed a soft tissue mass-like irregular lesion in his upper abdomen anterior to celiac axis, adjacent to transverse colon & anterior/inferior to pancreas-not attached to colon or pancreas & not readily accessible for percutaneous biopsy & probable lymph node complex.  She reports that the MD they saw said to see Dr Cyndie Chime when they informed him that they were seeing him. Encouraged her to have a copy of report sent to [place].  Dr. Cyndie Chime notified.  She reports that pt's surgeon was killed in a plane crash yest & they saw another MD.

## 2012-09-23 ENCOUNTER — Telehealth: Payer: Self-pay | Admitting: *Deleted

## 2012-09-23 NOTE — Telephone Encounter (Signed)
Called and spoke with pt wife Darel Hong), per Dr. Cyndie Chime, counts look good and stay on current dose of Hydrea; re-schedule when pt gets back on his feet.  Pt wife verbalized understanding and confirmed pt should be able to make it to appt set up for 12/06/12.

## 2012-09-28 ENCOUNTER — Other Ambulatory Visit (HOSPITAL_BASED_OUTPATIENT_CLINIC_OR_DEPARTMENT_OTHER): Payer: Medicare Other

## 2012-09-28 DIAGNOSIS — D45 Polycythemia vera: Secondary | ICD-10-CM

## 2012-09-28 LAB — COMPREHENSIVE METABOLIC PANEL (CC13)
AST: 15 U/L (ref 5–34)
Albumin: 3.9 g/dL (ref 3.5–5.0)
Alkaline Phosphatase: 94 U/L (ref 40–150)
Potassium: 4.1 mEq/L (ref 3.5–5.1)
Sodium: 142 mEq/L (ref 136–145)
Total Bilirubin: 1 mg/dL (ref 0.20–1.20)
Total Protein: 6.4 g/dL (ref 6.4–8.3)

## 2012-09-28 LAB — CBC WITH DIFFERENTIAL/PLATELET
Basophils Absolute: 0.2 10*3/uL — ABNORMAL HIGH (ref 0.0–0.1)
Eosinophils Absolute: 0 10*3/uL (ref 0.0–0.5)
HCT: 35 % — ABNORMAL LOW (ref 38.4–49.9)
HGB: 11.2 g/dL — ABNORMAL LOW (ref 13.0–17.1)
LYMPH%: 11.8 % — ABNORMAL LOW (ref 14.0–49.0)
MCV: 101.7 fL — ABNORMAL HIGH (ref 79.3–98.0)
MONO%: 4.2 % (ref 0.0–14.0)
NEUT#: 4.6 10*3/uL (ref 1.5–6.5)
NEUT%: 79.5 % — ABNORMAL HIGH (ref 39.0–75.0)
Platelets: 408 10*3/uL — ABNORMAL HIGH (ref 140–400)
RBC: 3.44 10*6/uL — ABNORMAL LOW (ref 4.20–5.82)

## 2012-09-28 LAB — MORPHOLOGY: PLT EST: ADEQUATE

## 2012-09-29 ENCOUNTER — Telehealth: Payer: Self-pay | Admitting: *Deleted

## 2012-09-29 NOTE — Telephone Encounter (Signed)
Called patient.  Let him know that labs are stable.  He is to stay on his same dose of hydrea.- which is 1000mg   Mon-Wed-Fri and then 500mg  the rest of the days.  He is able to tell me this dose.

## 2012-09-29 NOTE — Telephone Encounter (Signed)
Message copied by Orbie Hurst on Wed Sep 29, 2012  9:47 AM ------      Message from: Levert Feinstein      Created: Tue Sep 28, 2012  7:24 PM       Call pt lab stable  Continue current dose of Hydrea

## 2012-12-06 ENCOUNTER — Other Ambulatory Visit (HOSPITAL_BASED_OUTPATIENT_CLINIC_OR_DEPARTMENT_OTHER): Payer: Medicare Other

## 2012-12-06 ENCOUNTER — Telehealth: Payer: Self-pay | Admitting: Oncology

## 2012-12-06 ENCOUNTER — Ambulatory Visit (HOSPITAL_BASED_OUTPATIENT_CLINIC_OR_DEPARTMENT_OTHER): Payer: Medicare Other | Admitting: Oncology

## 2012-12-06 ENCOUNTER — Encounter: Payer: Self-pay | Admitting: Oncology

## 2012-12-06 VITALS — BP 155/80 | HR 76 | Temp 97.4°F | Resp 20 | Ht 73.0 in | Wt 220.7 lb

## 2012-12-06 DIAGNOSIS — R1900 Intra-abdominal and pelvic swelling, mass and lump, unspecified site: Secondary | ICD-10-CM

## 2012-12-06 DIAGNOSIS — D45 Polycythemia vera: Secondary | ICD-10-CM

## 2012-12-06 DIAGNOSIS — M7989 Other specified soft tissue disorders: Secondary | ICD-10-CM

## 2012-12-06 HISTORY — DX: Polycythemia vera: D45

## 2012-12-06 HISTORY — DX: Intra-abdominal and pelvic swelling, mass and lump, unspecified site: R19.00

## 2012-12-06 LAB — COMPREHENSIVE METABOLIC PANEL (CC13)
Alkaline Phosphatase: 87 U/L (ref 40–150)
BUN: 21 mg/dL (ref 7.0–26.0)
CO2: 25 mEq/L (ref 22–29)
Glucose: 107 mg/dl — ABNORMAL HIGH (ref 70–99)
Total Bilirubin: 0.91 mg/dL (ref 0.20–1.20)

## 2012-12-06 LAB — CBC WITH DIFFERENTIAL/PLATELET
BASO%: 0.7 % (ref 0.0–2.0)
HCT: 34.4 % — ABNORMAL LOW (ref 38.4–49.9)
LYMPH%: 10 % — ABNORMAL LOW (ref 14.0–49.0)
MCHC: 33.9 g/dL (ref 32.0–36.0)
MCV: 98.3 fL — ABNORMAL HIGH (ref 79.3–98.0)
MONO#: 0.4 10*3/uL (ref 0.1–0.9)
MONO%: 5.8 % (ref 0.0–14.0)
NEUT%: 82.9 % — ABNORMAL HIGH (ref 39.0–75.0)
Platelets: 323 10*3/uL (ref 140–400)
RBC: 3.49 10*6/uL — ABNORMAL LOW (ref 4.20–5.82)
WBC: 6.5 10*3/uL (ref 4.0–10.3)

## 2012-12-06 LAB — MORPHOLOGY
PLT EST: ADEQUATE
White Cell Comments: 4

## 2012-12-06 LAB — URIC ACID (CC13): Uric Acid, Serum: 6.8 mg/dl (ref 2.6–7.4)

## 2012-12-06 NOTE — Telephone Encounter (Signed)
Gave pt appt calendar for 12/31 lab and MD

## 2012-12-06 NOTE — Patient Instructions (Signed)
We will schedule a PET/CT scan - this week See you back to review results on 12/21/12

## 2012-12-07 NOTE — Progress Notes (Signed)
Hematology and Oncology Follow Up Visit  David Sullivan [id number] 1941-03-22 71 y.o. 12/07/2012 9:21 AM   Principle Diagnosis: Encounter Diagnoses  Name Primary?  . Polycythemia rubra vera   . Mass in the abdomen Yes     Interim History:   Followup visit for this  71 year old man is with long-standing polycythemia vera. Initial diagnosis March 2001. Initial phlebotomy program. 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 Blood counts were stable until approximately May of 2012 when blood counts fell to much lower than his baseline do to a recent dose increase of his Hydrea by another hematologist. He reestablished  with our office in June 2012. Around the same time he had an acute decompensation of his cardiac status. He developed dyspnea and fatigue disproportionate to his degree of anemia. He then developed progressive unstable angina. He was already status post coronary bypass surgery in 2007. He was diagnosed with sick sinus syndrome and had a permanent pacemaker placed prior to that in July 2005. In June 2012. he was hospitalized and found to have occlusion of his right coronary artery and a drug eluting stent was placed.  Due to subsequent no problems with recurrent atrial arrhythmias he had a radiofrequency ablation procedure done. He was started on Coumadin. He developed a large hematoma on his right hand. Coumadin was stopped and he was put on Xarelto. Subsequently, Xarelto was stopped and he is back on Coumadin.  His cardiac status remains unstable. Back in May of this year he had a followup cardiac cath for unstable angina. He developed a post procedure complication with a fistula formation. He required a surgical procedure on September 24. A CT scan done on August 20 to evaluate the fistula prior to surgery showed a abnormal soft tissue mass 4.9 x 3.5 cm anterior and inferior to the pancreas and adjacent to the  transverse colon. Since the procedure in September, he has had a persistent swelling of his lower extremities right greater than left. He has had a number of additional studies to exclude possibility of vascular occlusion or blood clots and these have been unremarkable according to his history.  I received a fax report of the CT scan done in August but no verbal communication from any of his physicians. I reviewed this report on 10/10/2012. I asked my office staff to obtain a CD so that I could review the study. Unfortunately, due to secretarial error, I never received the CD.   Medications: reviewed  Allergies: No Known Allergies  Review of Systems: Constitutional:   He fatigues easily. Although appetite is decreased, he is gaining weight. He denies any fevers or night sweats Respiratory: No cough or dyspnea Cardiovascular:  Currently no chest pain or palpitations Gastrointestinal: No change in bowel habit Genito-Urinary: No urinary tract symptoms Musculoskeletal: No muscle or bone pain Neurologic: No headache or change in vision no focal weakness Skin: No rash or ecchymosis Remaining ROS negative.  Physical Exam: Blood pressure 155/80, pulse 76, temperature 97.4 F (36.3 C), temperature source Oral, resp. rate 20, height 6\' 1"  (1.854 m), weight 220 lb 11.2 oz (100.109 kg). Wt Readings from Last 3 Encounters:  12/06/12 220 lb 11.2 oz (100.109 kg)  06/08/12 204 lb 1.6 oz (92.579 kg)  02/03/12 211 lb 8 oz (95.936 kg)     General appearance: Adequately nourished Caucasian man weight 221 pounds compared with 204 pounds at time of visit here  in June HENNT: Pharynx no erythema or exudate Lymph nodes: No cervical, supraclavicular, axillary, or inguinal adenopathy Breasts: Lungs: Clear to auscultation resonant to percussion Heart: Regular rhythm, 1-2/6 systolic murmur sternal border Abdomen: Soft, nontender, spleen enlarged 10 cm below left costal margin firm edge no other palpable  abdominal mass, no hepatomegaly Extremities: 2+ brawny edema right lower extremity 1+ edema left lower extremity Vascular: No cyanosis; right femoral pulse is 2+ no bruit Neurologic: Mental status and tach, PERRLA, optic disc sharp, vessels normal, motor strength 5 over 5, reflexes 1+ symmetric, sensation mildly decreased over the fingertips by tuning fork exam Skin: No rash or ecchymosis  Lab Results: Lab Results  Component Value Date   WBC 6.5 12/06/2012   HGB 11.7* 12/06/2012   HCT 34.4* 12/06/2012   MCV 98.3* 12/06/2012   PLT 323 12/06/2012     Chemistry      Component Value Date/Time   NA 139 12/06/2012 1502   NA 140 06/08/2012 1057   K 4.4 12/06/2012 1502   K 4.3 06/08/2012 1057   CL 108* 12/06/2012 1502   CL 108 06/08/2012 1057   CO2 25 12/06/2012 1502   CO2 20 06/08/2012 1057   BUN 21.0 12/06/2012 1502   BUN 18 06/08/2012 1057   CREATININE 1.2 12/06/2012 1502   CREATININE 1.22 06/08/2012 1057      Component Value Date/Time   CALCIUM 8.8 12/06/2012 1502   CALCIUM 9.2 06/08/2012 1057   ALKPHOS 87 12/06/2012 1502   ALKPHOS 72 06/08/2012 1057   AST 12 12/06/2012 1502   AST 13 06/08/2012 1057   ALT 10 12/06/2012 1502   ALT 8 06/08/2012 1057   BILITOT 0.91 12/06/2012 1502   BILITOT 0.8 06/08/2012 1057    LDH: 443 compare with 485 in October, for one in June, 357 in December 2012   Radiological Studies: see discussion above No results found.  Impression and Plan: #1. Polycythemia vera Blood counts controlled on current dose of Hydrea. Plan continue the same  #2. Intra-abdominal soft tissue mass unclear etiology Unfortunately, I suspect that we are dealing with lymphoma and that the persistent swelling of his lower extremities is due to lymphedema although this mass is higher in the abdomen than I would expect. The patient has a copy of the CD from the scan done back in August. His wife is going to bring it to our office when he returns this week for a PET/CT scan for  further evaluation. I anticipate he will need an open laparoscopic directed biopsy of this mass to obtain a diagnosis. I discussed this with him and his wife today.  #3. Advanced coronary artery disease status post MI, status post coronary stent, status post pacemaker  #4. 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  #6. Essential hypertension  #7. Type 2 diabetes  #8. Hypothyroid on replacement.  #9. History of atrial arrhythmias status post RFA on chronic anticoagulation  #10. Sick sinus syndrome status post permanent pacemaker   CC:. Dr. Wallace Cullens; Dr. Daryel November   Levert Feinstein, MD 12/17/20139:21 AM

## 2012-12-07 NOTE — Progress Notes (Signed)
Addendum:  CT report dated 08/10/12 scanned into our system on 09/30/12

## 2012-12-08 ENCOUNTER — Inpatient Hospital Stay
Admission: RE | Admit: 2012-12-08 | Discharge: 2012-12-08 | Disposition: A | Discharge status: Home / Self Care | Payer: Self-pay | Source: Ambulatory Visit | Attending: Oncology | Admitting: Oncology

## 2012-12-08 ENCOUNTER — Encounter (HOSPITAL_COMMUNITY)
Admission: RE | Admit: 2012-12-08 | Discharge: 2012-12-08 | Disposition: A | Discharge status: Home / Self Care | Payer: Medicare Other | Source: Ambulatory Visit | Attending: Oncology | Admitting: Oncology

## 2012-12-08 ENCOUNTER — Other Ambulatory Visit: Payer: Self-pay | Admitting: Oncology

## 2012-12-08 DIAGNOSIS — R1900 Intra-abdominal and pelvic swelling, mass and lump, unspecified site: Secondary | ICD-10-CM

## 2012-12-08 DIAGNOSIS — D45 Polycythemia vera: Secondary | ICD-10-CM | POA: Insufficient documentation

## 2012-12-08 DIAGNOSIS — Z09 Encounter for follow-up examination after completed treatment for conditions other than malignant neoplasm: Secondary | ICD-10-CM

## 2012-12-08 MED ORDER — FLUDEOXYGLUCOSE F - 18 (FDG) INJECTION
18.4000 | Freq: Once | INTRAVENOUS | Status: AC | PRN
  Administered 2012-12-08: 18.4 via INTRAVENOUS

## 2012-12-10 ENCOUNTER — Encounter: Payer: Self-pay | Admitting: Oncology

## 2012-12-10 ENCOUNTER — Other Ambulatory Visit: Payer: Self-pay | Admitting: Oncology

## 2012-12-10 DIAGNOSIS — R1900 Intra-abdominal and pelvic swelling, mass and lump, unspecified site: Secondary | ICD-10-CM

## 2012-12-10 DIAGNOSIS — D45 Polycythemia vera: Secondary | ICD-10-CM

## 2012-12-10 NOTE — Progress Notes (Signed)
I called the patient to review results of PET scan done yesterday. His wife brought the CD from prior CT scan done in [place] on August 20 and the radiologist was able to dictate an addendum to the report. There is an isolated area of low metabolic activity SUV 3.5 inferior to the pancreas and no other areas of abnormal metabolic activity. The mass by CT criteria measured 5.4 x 2.9 cm on the August study currently measures 3.9 x 2.5 cm. This is somewhat reassuring that this may be an atypical inflammatory process as opposed to a malignant process. There is no pelvic adenopathy to suggest that his asymmetric lower extremity edema is due to other areas of malignant adenopathy causing lymphedema.  I proposed to the patient and his wife that we defer a biopsy at this time and repeat a CT scan in 2 months.

## 2012-12-17 ENCOUNTER — Other Ambulatory Visit: Payer: Self-pay | Admitting: Oncology

## 2012-12-21 ENCOUNTER — Other Ambulatory Visit: Payer: Medicare Other | Admitting: Lab

## 2012-12-21 ENCOUNTER — Ambulatory Visit: Payer: Medicare Other | Admitting: Oncology

## 2012-12-29 ENCOUNTER — Telehealth: Payer: Self-pay | Admitting: Oncology

## 2012-12-29 NOTE — Telephone Encounter (Signed)
called pt and went over appt times    anne

## 2013-02-04 ENCOUNTER — Telehealth: Payer: Self-pay | Admitting: Oncology

## 2013-02-04 NOTE — Telephone Encounter (Signed)
Talked to pt's wife gave her appt for lab and CT on 2/28 and see MD on 3/3/1

## 2013-02-05 ENCOUNTER — Other Ambulatory Visit: Payer: Self-pay

## 2013-02-18 ENCOUNTER — Other Ambulatory Visit (HOSPITAL_COMMUNITY): Payer: Medicare Other

## 2013-02-18 ENCOUNTER — Other Ambulatory Visit: Payer: Medicare Other | Admitting: Lab

## 2013-02-18 ENCOUNTER — Ambulatory Visit: Payer: Medicare Other | Admitting: Oncology

## 2013-02-18 ENCOUNTER — Other Ambulatory Visit (HOSPITAL_BASED_OUTPATIENT_CLINIC_OR_DEPARTMENT_OTHER): Payer: Medicare Other | Admitting: Lab

## 2013-02-18 ENCOUNTER — Encounter (HOSPITAL_COMMUNITY): Payer: Self-pay

## 2013-02-18 ENCOUNTER — Ambulatory Visit (HOSPITAL_COMMUNITY)
Admission: RE | Admit: 2013-02-18 | Discharge: 2013-02-18 | Disposition: A | Discharge status: Home / Self Care | Payer: Medicare Other | Source: Ambulatory Visit | Attending: Oncology | Admitting: Oncology

## 2013-02-18 ENCOUNTER — Telehealth: Payer: Self-pay | Admitting: *Deleted

## 2013-02-18 DIAGNOSIS — M1610 Unilateral primary osteoarthritis, unspecified hip: Secondary | ICD-10-CM | POA: Insufficient documentation

## 2013-02-18 DIAGNOSIS — I700 Atherosclerosis of aorta: Secondary | ICD-10-CM | POA: Insufficient documentation

## 2013-02-18 DIAGNOSIS — D45 Polycythemia vera: Secondary | ICD-10-CM

## 2013-02-18 DIAGNOSIS — J984 Other disorders of lung: Secondary | ICD-10-CM | POA: Insufficient documentation

## 2013-02-18 DIAGNOSIS — N281 Cyst of kidney, acquired: Secondary | ICD-10-CM | POA: Insufficient documentation

## 2013-02-18 DIAGNOSIS — K8020 Calculus of gallbladder without cholecystitis without obstruction: Secondary | ICD-10-CM | POA: Insufficient documentation

## 2013-02-18 DIAGNOSIS — R161 Splenomegaly, not elsewhere classified: Secondary | ICD-10-CM | POA: Insufficient documentation

## 2013-02-18 DIAGNOSIS — K5730 Diverticulosis of large intestine without perforation or abscess without bleeding: Secondary | ICD-10-CM | POA: Insufficient documentation

## 2013-02-18 DIAGNOSIS — M169 Osteoarthritis of hip, unspecified: Secondary | ICD-10-CM | POA: Insufficient documentation

## 2013-02-18 DIAGNOSIS — R1900 Intra-abdominal and pelvic swelling, mass and lump, unspecified site: Secondary | ICD-10-CM

## 2013-02-18 LAB — COMPREHENSIVE METABOLIC PANEL (CC13)
ALT: 11 U/L (ref 0–55)
AST: 17 U/L (ref 5–34)
Calcium: 9.1 mg/dL (ref 8.4–10.4)
Chloride: 107 mEq/L (ref 98–107)
Creatinine: 1.3 mg/dL (ref 0.7–1.3)
Potassium: 4.2 mEq/L (ref 3.5–5.1)

## 2013-02-18 LAB — CBC WITH DIFFERENTIAL/PLATELET
Eosinophils Absolute: 0.1 10*3/uL (ref 0.0–0.5)
MONO#: 0.3 10*3/uL (ref 0.1–0.9)
NEUT#: 5.5 10*3/uL (ref 1.5–6.5)
RBC: 4.26 10*6/uL (ref 4.20–5.82)
RDW: 17 % — ABNORMAL HIGH (ref 11.0–14.6)
WBC: 6.9 10*3/uL (ref 4.0–10.3)
lymph#: 0.6 10*3/uL — ABNORMAL LOW (ref 0.9–3.3)
nRBC: 0 % (ref 0–0)

## 2013-02-18 LAB — LACTATE DEHYDROGENASE (CC13): LDH: 359 U/L — ABNORMAL HIGH (ref 125–245)

## 2013-02-18 MED ORDER — IOHEXOL 300 MG/ML  SOLN
100.0000 mL | Freq: Once | INTRAMUSCULAR | Status: AC | PRN
  Administered 2013-02-18: 100 mL via INTRAVENOUS

## 2013-02-18 MED ORDER — IOHEXOL 300 MG/ML  SOLN
50.0000 mL | Freq: Once | INTRAMUSCULAR | Status: AC | PRN
  Administered 2013-02-18: 50 mL via ORAL

## 2013-02-18 NOTE — Telephone Encounter (Signed)
Message copied by Orbie Hurst on Fri Feb 18, 2013 11:57 AM ------      Message from: David Sullivan      Created: Fri Feb 18, 2013 10:25 AM       Call pt - CBC best results in a long time; stay on current dose of Hydrea ------
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
## 2013-02-21 ENCOUNTER — Encounter: Payer: Self-pay | Admitting: Oncology

## 2013-02-21 ENCOUNTER — Ambulatory Visit: Payer: Medicare Other | Admitting: Oncology
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
## 2013-02-24 ENCOUNTER — Other Ambulatory Visit: Payer: Self-pay | Admitting: Oncology

## 2013-02-24 ENCOUNTER — Telehealth: Payer: Self-pay | Admitting: *Deleted

## 2013-02-24 DIAGNOSIS — D45 Polycythemia vera: Secondary | ICD-10-CM
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
## 2013-02-28 ENCOUNTER — Other Ambulatory Visit: Payer: Self-pay | Admitting: *Deleted

## 2013-03-02 ENCOUNTER — Other Ambulatory Visit: Payer: Self-pay | Admitting: Oncology

## 2013-03-02 ENCOUNTER — Telehealth: Payer: Self-pay | Admitting: *Deleted

## 2013-03-02 DIAGNOSIS — D45 Polycythemia vera: Secondary | ICD-10-CM

## 2013-03-02 DIAGNOSIS — R1900 Intra-abdominal and pelvic swelling, mass and lump, unspecified site: Secondary | ICD-10-CM

## 2013-03-02 NOTE — Telephone Encounter (Signed)
sw pt wife appt d/t was given for 03/09/2013 @ 2:15pm

## 2013-03-09 ENCOUNTER — Telehealth: Payer: Self-pay | Admitting: Oncology

## 2013-03-09 ENCOUNTER — Ambulatory Visit (HOSPITAL_BASED_OUTPATIENT_CLINIC_OR_DEPARTMENT_OTHER): Payer: Medicare Other | Admitting: Nurse Practitioner

## 2013-03-09 VITALS — BP 139/80 | HR 65 | Temp 96.8°F | Resp 18 | Ht 73.0 in | Wt 209.4 lb

## 2013-03-09 DIAGNOSIS — D45 Polycythemia vera: Secondary | ICD-10-CM

## 2013-03-09 DIAGNOSIS — R1900 Intra-abdominal and pelvic swelling, mass and lump, unspecified site: Secondary | ICD-10-CM

## 2013-03-09 NOTE — Telephone Encounter (Signed)
gve the pt his may,june 2014 appt calendar along with the ct scan appt. Pt aware that the rad dept will call to set up the ct scan appt.

## 2013-03-09 NOTE — Progress Notes (Signed)
OFFICE PROGRESS NOTE  Interval history:  David Sullivan is a 72 year old man with long-standing polycythemia vera. Initial diagnosis dates to March 2001. He was initially on a phlebotomy program. He had an embolic stroke in August 2001 and was started on Hydrea at that time due to a rising platelet count. Blood counts were stable until approximately May 2012 when there was a decline due to a dose increase in the [facility] by another hematologist. He reestablished with Dr. Cyndie Chime in June 2012. He continues Hydrea.  He was found to have an abnormal soft tissue mass measuring 5.3 x 2.9 cm anterior and inferior to the pancreas and adjacent to the transverse colon on a CT scan done 08/10/2012 to evaluate a fistula. PET scan done 12/09/2012 showed a soft tissue attenuating mass just above the ventral surface of the pancreas measuring 2.5 x 3.9 cm examining low-level metabolic activity.  Followup CT scan 02/18/2013 showed a soft tissue attenuating lesion ventral to the body of the pancreas measuring 3.4 x 2.2 cm.  He denies fevers or sweats. No abdominal pain. He has a good appetite. He continues Hydrea. No nausea or vomiting. No diarrhea. No mouth sores. He denies bleeding. He continues to have leg swelling. He has persistent back pain.   Objective: Blood pressure 139/80, pulse 65, temperature 96.8 F (36 C), temperature source Oral, resp. rate 18, height 6\' 1"  (1.854 m), weight 209 lb 6.4 oz (94.983 kg).  Oropharynx is without thrush or ulceration. No palpable cervical, supraclavicular, axillary or inguinal lymph nodes. Lungs are clear. Regular cardiac rhythm. Abdomen is soft and nontender. Spleen palpable 4-5 finger breaths below the left costal margin. No hepatomegaly. No palpable abdominal mass. Firm lower leg edema bilaterally left greater than right.  Lab Results: Lab Results  Component Value Date   WBC 6.9 02/18/2013   HGB 13.5 02/18/2013   HCT 42.4 02/18/2013   MCV 99.5* 02/18/2013   PLT 275  02/18/2013    Chemistry:    Chemistry      Component Value Date/Time   NA 139 02/18/2013 0908   NA 140 06/08/2012 1057   K 4.2 02/18/2013 0908   K 4.3 06/08/2012 1057   CL 107 02/18/2013 0908   CL 108 06/08/2012 1057   CO2 24 02/18/2013 0908   CO2 20 06/08/2012 1057   BUN 21.6 02/18/2013 0908   BUN 18 06/08/2012 1057   CREATININE 1.3 02/18/2013 0908   CREATININE 1.22 06/08/2012 1057      Component Value Date/Time   CALCIUM 9.1 02/18/2013 0908   CALCIUM 9.2 06/08/2012 1057   ALKPHOS 79 02/18/2013 0908   ALKPHOS 72 06/08/2012 1057   AST 17 02/18/2013 0908   AST 13 06/08/2012 1057   ALT 11 02/18/2013 0908   ALT 8 06/08/2012 1057   BILITOT 0.99 02/18/2013 0908   BILITOT 0.8 06/08/2012 1057       Studies/Results: Ct Abdomen Pelvis W Contrast  02/18/2013  *RADIOLOGY REPORT*  Clinical Data: Evaluate abdominal mass  CT ABDOMEN AND PELVIS WITH CONTRAST  Technique:  Multidetector CT imaging of the abdomen and pelvis was performed following the standard protocol during bolus administration of intravenous contrast.  Contrast: 50mL OMNIPAQUE IOHEXOL 300 MG/ML  SOLN, OMNIPAQUE IOHEXOL 300 MG/ML  SOLN PET CT from 12/08/2012  Comparison: 12/08/2012  Findings: There is no pleural effusion identified.  No pericardial effusion.  The lung bases appear clear.  Low attenuation structure within the right hepatic lobe is too small to characterize measuring 8 mm,  image 31. Adherent stone is identified within the gallbladder fundus.  No biliary dilatation. The pancreas is within normal limits.  The spleen is enlarged measuring 18.6 cm in craniocaudal dimension.  Previously the spleen was measured at 20 cm.  The adrenal glands are both normal.  Scarring and cyst noted within the interpolar region of the right kidney.  Left kidney is unremarkable.  The urinary bladder appears normal.  The prostate gland and seminal vesicles are unremarkable.  Calcified atherosclerotic disease affects the abdominal aorta and its branches.   There is no retroperitoneal adenopathy.  Soft tissue attenuating lesion ventral to the body of pancreas measures 3.4 x 2.2 cm, image 32/series 2.  This is compared with 3.9 x 2.5 cm previously.  No pelvic or inguinal adenopathy.  The stomach and the small bowel loops are normal.  No evidence for obstruction.  The appendix is visualized and appears normal. Normal appearance of the proximal colon.  There are multiple sigmoid diverticula identified.  No acute inflammation.  Review of the visualized bony structures shows multilevel degenerative disc disease.  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  No worrisome lytic or sclerotic bone lesions identified.  IMPRESSION:  1.  Mesenteric mass just ventral to the body of pancreas is decreased in size from 12/08/2012. 2.  Persistent splenomegaly. 3. Adherent stone identified within the gallbladder fundus.   Original Report Authenticated By: Signa Kell, M.D.     Medications: I have reviewed the patient's current medications.  Assessment/Plan:  1. Polycythemia vera. Blood counts well-controlled on current dose of Hydrea. He will continue the same. 2. Intra-abdominal soft tissue mass, increased etiology. The mass continues to decrease in size. 3. Advanced coronary artery disease status post MI, status post coronary stent, status post pacemaker. 4. Cardiac catheterization May 2013; post procedure complication with fistula formation status post surgical repair September 2013. 5. Remote embolic stroke related to #1. 6. Essential hypertension. 7. Type 2 diabetes. 8. Hypothyroid on replacement. 9. History of atrial arrhythmias. He is maintained on Coumadin. 10. Sick sinus syndrome status post permanent pacemaker.  Disposition-the abdominal mass continues to decrease in size. Dr. Cyndie Chime recommends a followup CT scan at a three-month interval. He will return for a followup visit one to 2 weeks after the scan to review the results.  Plan reviewed  with Dr. Cyndie Chime.  Lonna Cobb ANP/GNP-BC

## 2013-05-13 ENCOUNTER — Other Ambulatory Visit (HOSPITAL_BASED_OUTPATIENT_CLINIC_OR_DEPARTMENT_OTHER): Payer: Medicare Other | Admitting: Lab

## 2013-05-13 ENCOUNTER — Ambulatory Visit (HOSPITAL_COMMUNITY)
Admission: RE | Admit: 2013-05-13 | Discharge: 2013-05-13 | Disposition: A | Discharge status: Home / Self Care | Payer: Medicare Other | Source: Ambulatory Visit | Attending: Nurse Practitioner | Admitting: Nurse Practitioner

## 2013-05-13 ENCOUNTER — Encounter (HOSPITAL_COMMUNITY): Payer: Self-pay

## 2013-05-13 DIAGNOSIS — K4020 Bilateral inguinal hernia, without obstruction or gangrene, not specified as recurrent: Secondary | ICD-10-CM | POA: Insufficient documentation

## 2013-05-13 DIAGNOSIS — N281 Cyst of kidney, acquired: Secondary | ICD-10-CM | POA: Insufficient documentation

## 2013-05-13 DIAGNOSIS — K8020 Calculus of gallbladder without cholecystitis without obstruction: Secondary | ICD-10-CM | POA: Insufficient documentation

## 2013-05-13 DIAGNOSIS — R161 Splenomegaly, not elsewhere classified: Secondary | ICD-10-CM | POA: Insufficient documentation

## 2013-05-13 DIAGNOSIS — Q8909 Congenital malformations of spleen: Secondary | ICD-10-CM | POA: Insufficient documentation

## 2013-05-13 DIAGNOSIS — I708 Atherosclerosis of other arteries: Secondary | ICD-10-CM | POA: Insufficient documentation

## 2013-05-13 DIAGNOSIS — K5730 Diverticulosis of large intestine without perforation or abscess without bleeding: Secondary | ICD-10-CM | POA: Insufficient documentation

## 2013-05-13 DIAGNOSIS — K449 Diaphragmatic hernia without obstruction or gangrene: Secondary | ICD-10-CM | POA: Insufficient documentation

## 2013-05-13 DIAGNOSIS — R1900 Intra-abdominal and pelvic swelling, mass and lump, unspecified site: Secondary | ICD-10-CM | POA: Insufficient documentation

## 2013-05-13 DIAGNOSIS — I700 Atherosclerosis of aorta: Secondary | ICD-10-CM | POA: Insufficient documentation

## 2013-05-13 DIAGNOSIS — D45 Polycythemia vera: Secondary | ICD-10-CM

## 2013-05-13 DIAGNOSIS — N400 Enlarged prostate without lower urinary tract symptoms: Secondary | ICD-10-CM | POA: Insufficient documentation

## 2013-05-13 LAB — COMPREHENSIVE METABOLIC PANEL (CC13)
AST: 15 U/L (ref 5–34)
Alkaline Phosphatase: 70 U/L (ref 40–150)
BUN: 21.1 mg/dL (ref 7.0–26.0)
Creatinine: 1.1 mg/dL (ref 0.7–1.3)
Potassium: 4.4 mEq/L (ref 3.5–5.1)
Total Bilirubin: 1 mg/dL (ref 0.20–1.20)

## 2013-05-13 LAB — CBC WITH DIFFERENTIAL/PLATELET
BASO%: 4.9 % — ABNORMAL HIGH (ref 0.0–2.0)
EOS%: 0.8 % (ref 0.0–7.0)
HCT: 46.5 % (ref 38.4–49.9)
LYMPH%: 12.3 % — ABNORMAL LOW (ref 14.0–49.0)
MCH: 31.5 pg (ref 27.2–33.4)
MCHC: 31.8 g/dL — ABNORMAL LOW (ref 32.0–36.0)
MONO%: 5.7 % (ref 0.0–14.0)
NEUT%: 76.3 % — ABNORMAL HIGH (ref 39.0–75.0)
lymph#: 0.8 10*3/uL — ABNORMAL LOW (ref 0.9–3.3)

## 2013-05-13 MED ORDER — IOHEXOL 300 MG/ML  SOLN
100.0000 mL | Freq: Once | INTRAMUSCULAR | Status: AC | PRN
  Administered 2013-05-13: 100 mL via INTRAVENOUS

## 2013-05-17 ENCOUNTER — Telehealth: Payer: Self-pay | Admitting: *Deleted

## 2013-05-17 NOTE — Telephone Encounter (Signed)
Notified pt that cbc good & CT shows that mass in abd stable per dr Cyndie Chime.  He was pleased with this information.

## 2013-05-17 NOTE — Telephone Encounter (Signed)
Message copied by Sabino Snipes on Tue May 17, 2013  5:21 PM ------      Message from: Levert Feinstein      Created: Fri May 13, 2013  4:11 PM       Call pt:  CBC looks great.  CT: stable soft tissue mass in abdomen - not any bigger, not any smaller, compared with last study ------

## 2013-05-30 ENCOUNTER — Encounter: Payer: Self-pay | Admitting: Oncology

## 2013-05-30 ENCOUNTER — Ambulatory Visit (HOSPITAL_BASED_OUTPATIENT_CLINIC_OR_DEPARTMENT_OTHER): Payer: Medicare Other | Admitting: Oncology

## 2013-05-30 ENCOUNTER — Telehealth: Payer: Self-pay | Admitting: Oncology

## 2013-05-30 VITALS — BP 126/77 | HR 73 | Temp 96.7°F | Resp 20 | Ht 73.0 in | Wt 204.2 lb

## 2013-05-30 DIAGNOSIS — R197 Diarrhea, unspecified: Secondary | ICD-10-CM

## 2013-05-30 DIAGNOSIS — R1900 Intra-abdominal and pelvic swelling, mass and lump, unspecified site: Secondary | ICD-10-CM

## 2013-05-30 DIAGNOSIS — D45 Polycythemia vera: Secondary | ICD-10-CM

## 2013-05-30 HISTORY — DX: Diarrhea, unspecified: R19.7

## 2013-05-30 NOTE — Progress Notes (Signed)
Hematology and Oncology Follow Up Visit  David Sullivan [id number] [birth year] 72 y.o. 05/30/2013 2:13 PM   Principle Diagnosis: Encounter Diagnoses  Name Primary?  . Mass in the abdomen Yes  . Polycythemia rubra vera   . Acute diarrhea      Interim History:   Followup visit for this complicated 72 year old man with multiple medical and  problems followed in this office for many years for a myeloproliferative disorder-polycythemia vera.Initial diagnosis March 2001. Initial phlebotomy program. 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 Blood counts were stable until approximately May of 2012 when blood counts fell to much lower than his baseline do to a recent dose increase of his Hydrea by another hematologist. He reestablished with our office in June 2012. Around the same time he had an acute decompensation of his cardiac status. He developed dyspnea and fatigue disproportionate to his degree of anemia. He then developed progressive unstable angina. He was already status post coronary bypass surgery in 2007. He was diagnosed with sick sinus syndrome and had a permanent pacemaker placed prior to that in July 2005. In June 2012. he was hospitalized and found to have occlusion of his right coronary artery and a drug eluting stent was placed.  Due to subsequent no problems with recurrent atrial arrhythmias he had a radiofrequency ablation procedure done. He was started on Coumadin. He developed a large hematoma on his right hand. Coumadin was stopped and he was put on Xarelto. Subsequently, Xarelto was stopped and he is back on Coumadin.  In May of 2013,  he had a followup cardiac cath for unstable angina. He developed a post procedure complication with a fistula formation. He required a surgical procedure on September 24. A CT scan done on August 20 to evaluate the fistula prior to surgery showed a abnormal soft tissue mass 4.9 x  3.5 cm anterior and inferior to the pancreas and adjacent to the transverse colon.  Since the procedure in September, 2013,  he has had a persistent swelling of his lower extremities right greater than left. He has had a number of additional studies to exclude possibility of vascular occlusion or blood clots and these have been unremarkable according to his history.  It wasn't clear whether the mesenteric mass noted on the August 2013 CT scan was an inflammatory mass, hematoma, or a malignant mass. Outside CT was obtained and a PET CT scan was done here in [place] on 12/08/2012. The soft tissue mass appeared to be smaller measuring 2.5 x 3.9 cm with a minimal increase in metabolic activity with SUV of 3.5. Other than his known splenomegaly and incidental gallstones, there did not appear to be any other intra-abdominal mass or adenopathy. I elected to follow this radiographically. He has not had 2 subsequent CT scans on 02/18/2013 and 05/13/2013. I have reviewed these images with him and his wife. Although there has been no further reduction in the size of this area of abnormal soft tissue density, it has remained stable with current  measurement of 3.4 x 2.2 cm on the May 23 scan compared with 3.4 x 2.2 on the February 28 study.  His cardiac condition has finally stabilized. He has had no new cardiac events and no new medications. He has persistent peripheral edema which remains unexplained.  His polycythemia vera he is well-controlled on his current dose of Hydrea 1000 mg Monday Wednesday Friday and 500 mg the  other days of the week.  He has had a acute diarrheal illness over the last 72 hours which appears to be subsiding. He has lost a significant amount of weight. He was 221 pounds in December, 209 pounds in March, 204 pounds today. He attributes some of this weight loss to the recent diarrhea. Appetite is still good.  He continues to have problems with advanced degenerative arthritis of the spine  and just had a epidural steroid injection for a lumbar radiculopathy one week ago on June 5.    Medications: reviewed  Allergies: No Known Allergies  Review of Systems: Constitutional:   Easy fatigue. No fevers. Weight loss is noted. Respiratory: No cough or dyspnea at rest Cardiovascular:  No chest pain or palpitations Gastrointestinal: See above. No hematochezia. No recent antibiotics. Genito-Urinary: Not questioned Musculoskeletal: Back pain and lumbar radiculopathy from arthritis Neurologic: No headache or change in vision Skin: No rash or ecchymosis Remaining ROS negative.  Physical Exam: Blood pressure 126/77, pulse 73, temperature 96.7 F (35.9 C), temperature source Oral, resp. rate 20, height 6\' 1"  (1.854 m), weight 204 lb 3.2 oz (92.625 kg). Wt Readings from Last 3 Encounters:  05/30/13 204 lb 3.2 oz (92.625 kg)  03/09/13 209 lb 6.4 oz (94.983 kg)  12/06/12 220 lb 11.2 oz (100.109 kg)     General appearance: Now chronically ill-appearing Caucasian man. He has lost muscle mass from his face HENNT: Pharynx no erythema or exudate Lymph nodes: No adenopathy Breasts: Lungs: Clear to auscultation resonant to percussion Heart: Regular rhythm no murmur or gallop Abdomen: Soft, nontender, no mass, no organomegaly Extremities: 2+ ankle edema Musculoskeletal: GU: Vascular: No cyanosis. No carotid bruits. Neurologic: Motor strength 5 over 5. Reflexes 1+ symmetric Skin: No rash or ecchymosis  Lab Results: Lab Results  Component Value Date   WBC 6.2 05/13/2013   HGB 14.8 05/13/2013   HCT 46.5 05/13/2013   MCV 98.9* 05/13/2013   PLT 266 Large & giant platelets 05/13/2013     Chemistry      Component Value Date/Time   NA 140 05/13/2013 0902   NA 140 06/08/2012 1057   K 4.4 05/13/2013 0902   K 4.3 06/08/2012 1057   CL 106 05/13/2013 0902   CL 108 06/08/2012 1057   CO2 25 05/13/2013 0902   CO2 20 06/08/2012 1057   BUN 21.1 05/13/2013 0902   BUN 18 06/08/2012 1057   CREATININE  1.1 05/13/2013 0902   CREATININE 1.22 06/08/2012 1057      Component Value Date/Time   CALCIUM 9.2 05/13/2013 0902   CALCIUM 9.2 06/08/2012 1057   ALKPHOS 70 05/13/2013 0902   ALKPHOS 72 06/08/2012 1057   AST 15 05/13/2013 0902   AST 13 06/08/2012 1057   ALT 15 05/13/2013 0902   ALT 8 06/08/2012 1057   BILITOT 1.00 05/13/2013 0902   BILITOT 0.8 06/08/2012 1057       Radiological Studies: Ct Abdomen Pelvis W Contrast  05/13/2013   *RADIOLOGY REPORT*  Clinical Data: Follow up mesenteric mass, splenomegaly  CT ABDOMEN AND PELVIS WITH CONTRAST  Technique:  Multidetector CT imaging of the abdomen and pelvis was performed following the standard protocol during bolus administration of intravenous contrast.  Contrast: OMNIPAQUE IOHEXOL 300 MG/ML  SOLN  Comparison: 02/18/2013  Findings: Sagittal images of the spine shows degenerative changes thoracolumbar spine.  Significant disc space flattening with vacuum disc phenomenon at L4-L5 level.  Lung bases are unremarkable.  Small hiatal hernia.  Enhanced liver shows no  biliary ductal dilatation.  Small accessory splenule is noted.  Splenomegaly is noted with spleen measuring 21 cm in length.  Again noted calcified gallstone in the gallbladder fundal region measures 6 mm.  Again noted mid mesenteric lesion just anterior to the pancreas measures 3.4 by 2.2 cm.  This is stable in size and appearance from prior exam.  Atherosclerotic calcifications of the abdominal aorta and splenic artery again noted.  Atherosclerotic calcifications of the iliac arteries.  No aortic aneurysm.  Scattered left colon diverticula.  No evidence of acute diverticulitis.  Multiple sigmoid colon diverticula.  No evidence of acute diverticulitis.  The pancreas and adrenal glands are unremarkable.  No small bowel obstruction.  No ascites or free air.  Stool noted within cecum.  The terminal ileum is unremarkable.  No pericecal inflammation.  Normal appendix is clearly visualized in axial image  49.  Kidneys are symmetrical in size and enhancement.  Delayed renal images shows bilateral renal symmetrical excretion.  There is a cyst in the lower pole of the right kidney measures 2.6 cm.  A smaller cyst in lower pole of the right kidney measures 1 cm. Bilateral visualized proximal ureter is unremarkable.  There is mild enlarged prostate gland with indentation of urinary bladder base.  Prostate gland measures 4.1 x 4.7 cm in diameter. Stool noted in the rectosigmoid colon.  No inguinal adenopathy. Postsurgical changes are noted right inguinal region.  Small bilateral inguinal scrotal canal hernia containing fat without evidence of acute complication.  IMPRESSION:  1.  Stable in size and appearance mid mesenteric mass just anterior to the pancreas. 2.  Splenomegaly again noted. 3.  No hydronephrosis or hydroureter.  Right renal cysts are noted. 4.  No small bowel obstruction. 5.  Distal colonic diverticula.  No evidence of acute diverticulitis. 6.  No pericecal inflammation.  Normal appendix.   Original Report Authenticated By: Natasha Mead, M.D.    Impression:  #1. Polycythemia vera  Blood counts controlled on current dose of Hydrea. Plan continue the same. I will decrease frequency of blood counts every 2 months.  #2. Intra-abdominal soft tissue mass unclear etiology  Stable size on serial CT scans done over the last 6 months after initial reduction in size compared with the initial study done in August 2013. Although it is not possible to definitively exclude lymphoma, I think the likelihood of this is much less at this point given stability over time. Plan: I will obtain another followup scan the time of his visit here in October. I think if this study is stable as well but we do need to continue getting frequent scans.  #3. Advanced coronary artery disease status post MI, status post coronary stent, status post pacemaker   #4. Iatrogenic femoral artery dissection? Versus AV fistula status post  surgical repair   #5. 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   #6. Essential hypertension   #7. Type 2 diabetes   #8. Hypothyroid on replacement.   #9. History of atrial arrhythmias status post RFA on chronic anticoagulation   #10. Sick sinus syndrome status post permanent pacemaker   #11. Acute diarrheal illness. No recent antibiotics. Hopefully this will be self-limited. I told him to get some Imodium right ear over the counter and call if symptoms progress.    CC:. Dr. Wallace Cullens; Dr. Daryel November     Levert Feinstein, MD 6/9/20142:13 PM

## 2013-05-30 NOTE — Telephone Encounter (Signed)
Pt does not want appt with ML on 10/6, emailed MD regarding pt request to see MD, pt wants CT same day as CT scan

## 2013-05-31 ENCOUNTER — Telehealth: Payer: Self-pay | Admitting: Oncology

## 2013-05-31 NOTE — Telephone Encounter (Signed)
Talked to pt's wife and gave her appt for lab , CT and MD for October 2014

## 2013-07-13 ENCOUNTER — Telehealth: Payer: Self-pay | Admitting: Oncology

## 2013-07-13 NOTE — Telephone Encounter (Signed)
pt called and requested lab be cancelled due to surgery, nurse notified

## 2013-07-14 ENCOUNTER — Other Ambulatory Visit: Payer: Medicare Other | Admitting: Lab

## 2013-07-27 ENCOUNTER — Other Ambulatory Visit: Payer: Self-pay

## 2013-08-10 ENCOUNTER — Telehealth: Payer: Self-pay | Admitting: *Deleted

## 2013-08-10 NOTE — Telephone Encounter (Signed)
Received fax with lab results from [facility](?)  Called wife and let her know that fax was unreadable.  No phone number on cover sheet to contact provider.  Wife said she will get  Copy and mail it to [place].  Pt. Is currently in rehab due to extensive back surgery.  His cardiologist takes care of his PT/INR (per patient's wife).

## 2013-09-09 ENCOUNTER — Telehealth: Payer: Self-pay | Admitting: Oncology

## 2013-09-09 NOTE — Telephone Encounter (Signed)
pt's wife called to cx all 3 appts on 10/6 lb/ct/JG due to pt had back surgery and is in a brace and rehab - per wife they will have to do this ([facility] - appts) when they're done w/all that - message to [employer]

## 2013-09-26 ENCOUNTER — Other Ambulatory Visit: Payer: Medicare Other | Admitting: Lab

## 2013-09-26 ENCOUNTER — Ambulatory Visit: Payer: Medicare Other | Admitting: Oncology

## 2013-09-26 ENCOUNTER — Ambulatory Visit (HOSPITAL_COMMUNITY): Payer: Medicare Other

## 2013-09-26 ENCOUNTER — Ambulatory Visit: Payer: Medicare Other | Admitting: Nurse Practitioner

## 2013-10-13 ENCOUNTER — Telehealth: Payer: Self-pay | Admitting: *Deleted

## 2013-10-13 NOTE — Telephone Encounter (Signed)
Per Dr. Cyndie Chime; notified pt to stay on same dose of Hydrea.  Pt verbalized understanding and states "I've had back surgery and am doing PT now; will re-schedule appt with Dr. Cyndie Chime as soon as I can. "

## 2013-10-27 ENCOUNTER — Other Ambulatory Visit: Payer: Self-pay

## 2014-02-01 ENCOUNTER — Telehealth: Payer: Self-pay | Admitting: *Deleted

## 2014-02-01 ENCOUNTER — Other Ambulatory Visit: Payer: Self-pay | Admitting: Oncology

## 2014-02-01 NOTE — Telephone Encounter (Signed)
Spoke with Dr Beryle Beams regarding refill request on Ferrex, he would like to know that patient is under care for his PCV before filling this. Left message for patient to get back in touch with [place] and then let us know when he can come in for appt before 03/13/2014. If patient is seeing another Heme/Onc, we will be glad to forward this to that MD.

## 2014-02-20 ENCOUNTER — Encounter: Payer: Self-pay | Admitting: Oncology

## 2014-06-06 ENCOUNTER — Encounter: Payer: Medicare Other | Admitting: Oncology

## 2014-09-26 ENCOUNTER — Telehealth: Payer: Self-pay | Admitting: *Deleted

## 2014-09-26 NOTE — Telephone Encounter (Signed)
Rehab facility that pt was admitted to 09/19/2014 after total hip procedure at [facility] calls to say pt had cbc today and  Hgb= 7.4 Hct= 24.4 WBC= 15.79 PLT= 754 appt scheduled w/ dr Beryle Beams 11/13/2014 @ 0845 Spoke w/ dr Beryle Beams, the rehab facility is instructed to call pt's PCP immediately, they have faxed the cbc results and will place in dr granfortuna's box, that possibly a work in appt will be made for pt with dr Beryle Beams but it is urgent that she notify PCP now, she is agreeable and states she will do that

## 2014-09-27 NOTE — Telephone Encounter (Signed)
I never got complete history that patient just had a hip replacement - that explains sudden fall in Hemoglobin compared with recent baseline and rise in platelet count.  Transfusion still reasonable given underlying cardiac disease but he should also be put on iron replacement.

## 2014-11-13 ENCOUNTER — Ambulatory Visit (INDEPENDENT_AMBULATORY_CARE_PROVIDER_SITE_OTHER): Payer: Medicare Other | Admitting: Oncology

## 2014-11-13 ENCOUNTER — Encounter: Payer: Self-pay | Admitting: Oncology

## 2014-11-13 VITALS — BP 121/56 | HR 70 | Temp 97.8°F | Ht 73.0 in | Wt 201.5 lb

## 2014-11-13 DIAGNOSIS — I252 Old myocardial infarction: Secondary | ICD-10-CM

## 2014-11-13 DIAGNOSIS — E119 Type 2 diabetes mellitus without complications: Secondary | ICD-10-CM

## 2014-11-13 DIAGNOSIS — D473 Essential (hemorrhagic) thrombocythemia: Secondary | ICD-10-CM

## 2014-11-13 DIAGNOSIS — I10 Essential (primary) hypertension: Secondary | ICD-10-CM

## 2014-11-13 DIAGNOSIS — Z950 Presence of cardiac pacemaker: Secondary | ICD-10-CM

## 2014-11-13 DIAGNOSIS — I2510 Atherosclerotic heart disease of native coronary artery without angina pectoris: Secondary | ICD-10-CM

## 2014-11-13 DIAGNOSIS — D45 Polycythemia vera: Secondary | ICD-10-CM

## 2014-11-13 DIAGNOSIS — E039 Hypothyroidism, unspecified: Secondary | ICD-10-CM

## 2014-11-13 DIAGNOSIS — R1909 Other intra-abdominal and pelvic swelling, mass and lump: Secondary | ICD-10-CM

## 2014-11-13 LAB — COMPREHENSIVE METABOLIC PANEL
ALT: 8 U/L (ref 0–53)
AST: 12 U/L (ref 0–37)
Albumin: 3.9 g/dL (ref 3.5–5.2)
Alkaline Phosphatase: 100 U/L (ref 39–117)
BILIRUBIN TOTAL: 0.9 mg/dL (ref 0.2–1.2)
BUN: 28 mg/dL — ABNORMAL HIGH (ref 6–23)
CO2: 26 meq/L (ref 19–32)
Calcium: 9.2 mg/dL (ref 8.4–10.5)
Chloride: 101 mEq/L (ref 96–112)
Creat: 1.09 mg/dL (ref 0.50–1.35)
Glucose, Bld: 94 mg/dL (ref 70–99)
Potassium: 4.6 mEq/L (ref 3.5–5.3)
Sodium: 135 mEq/L (ref 135–145)
Total Protein: 5.4 g/dL — ABNORMAL LOW (ref 6.0–8.3)

## 2014-11-13 LAB — URIC ACID: URIC ACID, SERUM: 8.3 mg/dL — AB (ref 4.0–7.8)

## 2014-11-13 LAB — LACTATE DEHYDROGENASE: LDH: 383 U/L — AB (ref 94–250)

## 2014-11-13 NOTE — Progress Notes (Signed)
Patient ID: David Sullivan, male   DOB: June 18, 1941, 73 y.o.   MRN: [id number] Hematology and Oncology Follow Up Visit  David Sullivan [id number] 1941-03-18 73 y.o. 11/13/2014 11:47 AM   Principle Diagnosis: Encounter Diagnosis  Name Primary?  . Polycythemia vera Yes     Interim History:  I have not seen David Sullivan in over a year. A lot has transpired. He has numerous medical and surgical issues. He sees me for a myeloproliferative disorder, polycythemia vera with associated thrombocythemia, initially diagnosed in March 2001. He was on aspirin and a phlebotomy program until he presented with an embolic stroke in August 2001. Hydroxyurea added to his regimen. Current dose 1000 mg Mondays, Wednesdays, and Fridays, 500 mg  other days of the week.  He has advanced coronary artery disease. He underwent initial pacemaker placement in July 2005 for sick sinus syndrome. He underwent coronary bypass surgery in November 2007. He developed symptomatic atrial fibrillation and underwent cardioversion in February 2008. He subsequently had a ablation procedure in November 2012. He developed unstable angina and underwent right coronary stenting in August 2012. He had replacement of coronary stents for occluded bypass graft in May 2015. 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 He required surgical repair of this in September 2013.  He has significant degenerative arthritis. He had low back surgery with a fusion procedure in August 2014. 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 He spent about one month in rehabilitation. He required blood transfusion postop. (2 units) and then he required another 2 units when he was in the rehabilitation facility when hemoglobin was measured on October 6 and found to be 7.4. Follow-up hemoglobin on October 13 was up to 9.4. White count was 18,400,  platelet count 741,000, MCV 93.  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  He is currently ambulating with a cane. He denies any chest pain, palpitations, dyspnea. No chest pressure.  He had a very stressful year with both cardiac and orthopedic problems outlined above. Both he and his wife lost a significant amount of weight. In addition, to all of his medical problems, they have had some personal tragedies. Their son-in-law committed suicide.  There still living in the [place] area of [place]. He is getting some of his medical care at the [facility]. His cardiologist is also in [place].     Medications: Current outpatient prescriptions: ALPRAZolam (XANAX) 1 MG tablet, Take 1 mg by mouth 2 (two) times daily as needed., Disp: , Rfl: ;  amLODipine (NORVASC) 10 MG tablet, Take 10 mg by mouth daily., Disp: , Rfl: ;  aspirin 81 MG tablet, Take 81 mg by mouth daily., Disp: , Rfl: ;  FERREX 150 150 MG capsule, TAKE ONE CAPSULE BY MOUTH TWICE DAILY, Disp: 60 capsule, Rfl: PRN Fish Oil-Cholecalciferol (OMEGA-3 FISH OIL/VITAMIN D3) 1000-1000 MG-UNIT CAPS, Take 1,200 mg by mouth daily. With Vit D 2000IU, Disp: , Rfl: ;  glipiZIDE (GLUCOTROL) 10 MG tablet, Take 10 mg by mouth 2 (two) times daily before a meal. 1/2 tab two times daily as directed.  Breakfast & supper, Disp: , Rfl: ;  HYDROcodone-acetaminophen (LORTAB) 7.5-500 MG per tablet, Take 1 tablet by mouth every 6 (six) hours as needed for pain., Disp: , Rfl:  hydroxyurea (HYDREA) 500 MG capsule, Take 500 mg by mouth.  500mg  daily except 1000mg  MWF, Disp: , Rfl: ;  irbesartan (AVAPRO) 300 MG tablet, Take 300 mg by mouth 2 (two) times daily., Disp: , Rfl: ;  levothyroxine (SYNTHROID, LEVOTHROID) 75 MCG tablet, Take 200 mcg by mouth daily. , Disp: , Rfl: ;  metoprolol (LOPRESSOR) 50 MG tablet, Take 50 mg by mouth 2 (two) times daily., Disp: , Rfl:  nitroGLYCERIN (NITROSTAT) 0.4 MG  SL tablet, Place 0.4 mg under the tongue every 5 (five) minutes as needed., Disp: , Rfl: ;  omeprazole (PRILOSEC) 20 MG capsule, Take 20 mg by mouth 2 (two) times daily., Disp: , Rfl: ;  pravastatin (PRAVACHOL) 40 MG tablet, Take 40 mg by mouth daily. Takes 2 tablets once a day, Disp: , Rfl: ;  traZODone (DESYREL) 150 MG tablet, Take 150 mg by mouth at bedtime. 1 tab at bedtime as needed for sleep, Disp: , Rfl:  warfarin (COUMADIN) 5 MG tablet, Take 7.5 mg by mouth daily. , Disp: , Rfl:   Allergies: No Known Allergies  Review of Systems: See history of present illness Remaining ROS negative:   Physical Exam: Blood pressure 121/56, pulse 70, temperature 97.8 F (36.6 C), temperature source Oral, height 6\' 1"  (1.854 m), weight 201 lb 8 oz (91.4 kg), SpO2 98 %. Wt Readings from Last 3 Encounters:  11/13/14 201 lb 8 oz (91.4 kg)  05/30/13 204 lb 3.2 oz (92.625 kg)  03/09/13 209 lb 6.4 oz (94.983 kg)     General appearance: Cachectic appearing Caucasian man HENNT: Pharynx no erythema, exudate, mass, or ulcer. No thyromegaly or thyroid nodules Lymph nodes: No cervical, supraclavicular, or axillary lymphadenopathy Breasts:  Lungs: Clear to auscultation, resonant to percussion throughout Heart: Regular rhythm, 3/6 systolic murmur left sternal border and aortic area, no gallop, no rub, no click, no edema Abdomen: Soft, nontender, normal bowel sounds, no mass, no organomegaly Extremities: No edema, no calf tenderness Musculoskeletal: no joint deformities GU:  Vascular: Carotid pulses 2+, no bruits, Neurologic: Alert, oriented, PERRLA, optic disc sharp on the left, I could not get a good look at the disc on the right and vessels normal, no hemorrhage or exudate, cranial nerves grossly normal, motor strength 5 over 5, reflexes absent symmetric, upper body coordination normal, gait normal, he is walking with a cane Skin: No rash or ecchymosis. A healing area left side of his nose where a recent skin  cancer removal. Irregular areas on both of his ears with skin cancers have been removed. There is an active lesion on the dorsum of his right hand.  Lab Results: CBC W/Diff    Component Value Date/Time   WBC 6.2 05/13/2013 0902   RBC 4.70 05/13/2013 0902   HGB 14.8 05/13/2013 0902   HCT 46.5 05/13/2013 0902   PLT 266 Large & giant platelets 05/13/2013 0902   MCV 98.9* 05/13/2013 0902   MCH 31.5 05/13/2013 0902   MCHC 31.8* 05/13/2013 0902   RDW 17.3* 05/13/2013 0902   LYMPHSABS 0.8* 05/13/2013 0902   MONOABS 0.4 05/13/2013 0902   EOSABS 0.1 05/13/2013 0902   BASOSABS 0.3* 05/13/2013 0902     Chemistry      Component Value Date/Time   NA 140 05/13/2013 0902   NA 140 06/08/2012 1057   K 4.4 05/13/2013 0902   K 4.3 06/08/2012 1057   CL 106 05/13/2013 0902   CL 108 06/08/2012 1057   CO2 25 05/13/2013 0902   CO2 20 06/08/2012 1057   BUN 21.1 05/13/2013 0902   BUN  18 06/08/2012 1057   CREATININE 1.1 05/13/2013 0902   CREATININE 1.22 06/08/2012 1057      Component Value Date/Time   CALCIUM 9.2 05/13/2013 0902   CALCIUM 9.2 06/08/2012 1057   ALKPHOS 70 05/13/2013 0902   ALKPHOS 72 06/08/2012 1057   AST 15 05/13/2013 0902   AST 13 06/08/2012 1057   ALT 15 05/13/2013 0902   ALT 8 06/08/2012 1057   BILITOT 1.00 05/13/2013 0902   BILITOT 0.8 06/08/2012 1057     Today's labs pending    Impression:  #1. Myeloproliferative disorder Fluctuations in his hemoglobins primarily related to multiple surgical procedures over the last year. White blood count and platelet count were in reasonable ranges through May 2014. Recent value done while he was in rehabilitation in October showed elevation of both white count and platelets but some of this might be postsurgical and related to acute inflammation. Repeat lab done today and results are pending. I will adjust his Hydrea if necessary. I'm going to arrange for him to have blood samples drawn every 2 months close to his home and have  results forwarded to me. Given all his other medical problems over the last 12 months, he fell off the radar screen and I have not received any blood work on a regular basis.   #2. Intra-abdominal soft tissue mass unclear etiology  He is overdue for a follow-up scan. I will wait to see what his kidney function is before scheduling this.  #3. Advanced coronary artery disease status post MI, status post coronary stent, status post pacemaker, status post coronary bypass surgery, status post repeat coronary stents for occlusion of bypass grafts.  #4. 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   #6. Essential hypertension   #7. Type 2 diabetes   #8. Hypothyroid on replacement.   #9. History of atrial arrhythmias status post RFA on chronic anticoagulation   #10. Sick sinus syndrome status post permanent pacemaker   #11. Status post lumbar spine surgery August 2014  #12. Status post right hip replacement September 2015    CC: Patient Care Team: 1.Myrla Halsted, MD  PCP - General: [facility], [address], [place] [address]. Phone: [phone] 2. Orpah Greek M.D., cardiology, [address], [place], [place], [address]. [phone]  Annia Belt, MD 11/23/201511:47 AM

## 2014-11-13 NOTE — Patient Instructions (Signed)
To lab today - we will call you with results Lab in [place] every 2 months: send copy to me & we can make adjustment to your Hydrea as needed Visit with Dr Darnell Level in 6 months

## 2014-11-14 LAB — CBC WITH DIFFERENTIAL/PLATELET
Basophils Absolute: 0.5 10*3/uL — ABNORMAL HIGH (ref 0.0–0.1)
Basophils Relative: 4 % — ABNORMAL HIGH (ref 0–1)
Eosinophils Absolute: 0.1 10*3/uL (ref 0.0–0.7)
Eosinophils Relative: 1 % (ref 0–5)
HCT: 35.1 % — ABNORMAL LOW (ref 39.0–52.0)
Hemoglobin: 11.1 g/dL — ABNORMAL LOW (ref 13.0–17.0)
LYMPHS ABS: 1.2 10*3/uL (ref 0.7–4.0)
Lymphocytes Relative: 10 % — ABNORMAL LOW (ref 12–46)
MCH: 26.1 pg (ref 26.0–34.0)
MCHC: 31.6 g/dL (ref 30.0–36.0)
MCV: 82.6 fL (ref 78.0–100.0)
MONOS PCT: 5 % (ref 3–12)
MPV: 11.4 fL (ref 9.4–12.4)
Monocytes Absolute: 0.6 10*3/uL (ref 0.1–1.0)
NEUTROS ABS: 9.6 10*3/uL — AB (ref 1.7–7.7)
NEUTROS PCT: 80 % — AB (ref 43–77)
Platelets: 519 10*3/uL — ABNORMAL HIGH (ref 150–400)
RBC: 4.25 MIL/uL (ref 4.22–5.81)
RDW: 20 % — ABNORMAL HIGH (ref 11.5–15.5)
WBC: 12 10*3/uL — ABNORMAL HIGH (ref 4.0–10.5)

## 2014-11-15 ENCOUNTER — Telehealth: Payer: Self-pay | Admitting: *Deleted

## 2014-11-15 NOTE — Telephone Encounter (Signed)
-----   Message from Annia Belt, MD sent at 11/14/2014  9:41 AM EST ----- Call patient/wife: hemoglobin acceptable at 11.1; WBC & platelet count OK on current dose of Hydrea. I would advise that he take an iron supplement like ferrous sulfate 325 mg TID or Niferex 150 mg BID.

## 2014-11-15 NOTE — Telephone Encounter (Signed)
Pt called/informed "Hgb acceptable @ 11.1; [name]" per Dr Beryle Beams. Pt states he already takes Nu Iron 150 mg BID. Thanks

## 2015-01-18 ENCOUNTER — Telehealth: Payer: Self-pay | Admitting: *Deleted

## 2015-01-18 NOTE — Telephone Encounter (Signed)
Pt called/informed of blood counts - Hgb 12.7, platelets 598,000 and to continue current dose of Hydrea per Dr Beryle Beams. And need to check counts in 2 months; will call pt  Back to schedule lab appt.

## 2015-01-18 NOTE — Telephone Encounter (Signed)
-----   Message from Annia Belt, MD sent at 01/18/2015  1:45 PM EST ----- Please call patient counts in acceptable range:  Hb 12.7, platelets 598,000. Continue current dose of Hydrea. Check counts again in 2 months

## 2015-01-22 NOTE — Telephone Encounter (Signed)
Talked to pt -stated he will have lab done where he lives and faxed to Dr Beryle Beams. Also stated MD is awared and pt has fax #.

## 2015-02-04 IMAGING — CT CT ABD-PELV W/ CM
2 of 4 series · 16 of 46 positions shown, 18 images · IV contrast (OMNIPAQUE)
Comparison: 02/18/2013

CLINICAL DATA: Follow up mesenteric mass, splenomegaly

CT ABDOMEN AND PELVIS WITH CONTRAST
TECHNIQUE: Multidetector CT imaging of the abdomen and pelvis was
performed following the standard protocol during bolus
administration of intravenous contrast.
Contrast: 100mL OMNIPAQUE IOHEXOL 300 MG/ML  SOLN

[Series 2: rtn a/p with · axial · 0.80mm/px · z∈[-474,-34]mm · 13 of 98 slices shown, 15 images]
[im 5/98  soft-tissue]
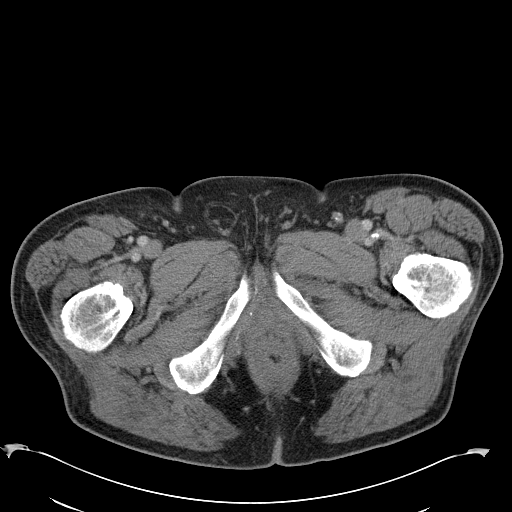
[im 5/98  bone]
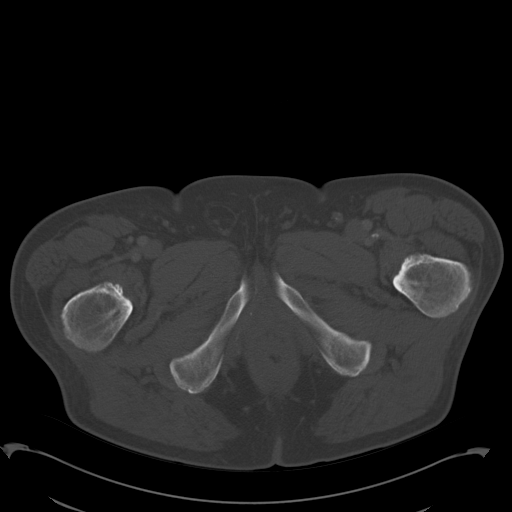
[im 13/98  soft-tissue]
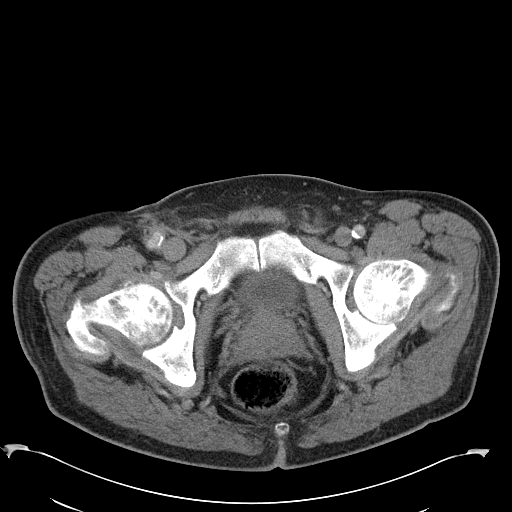
[im 22/98  soft-tissue]
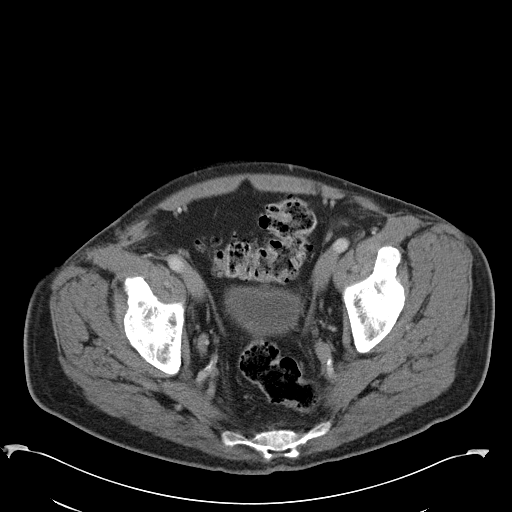
[im 26/98  soft-tissue]
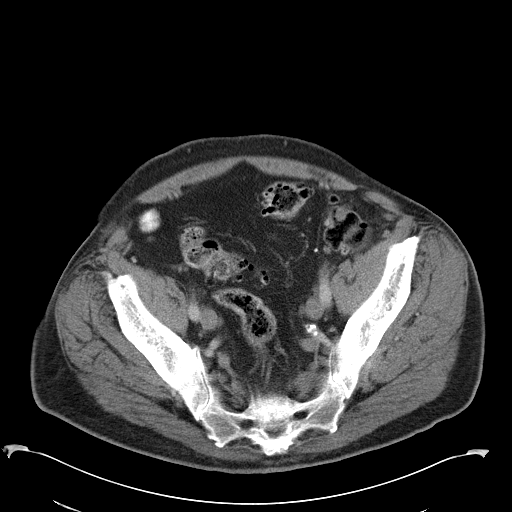
[im 34/98  soft-tissue]
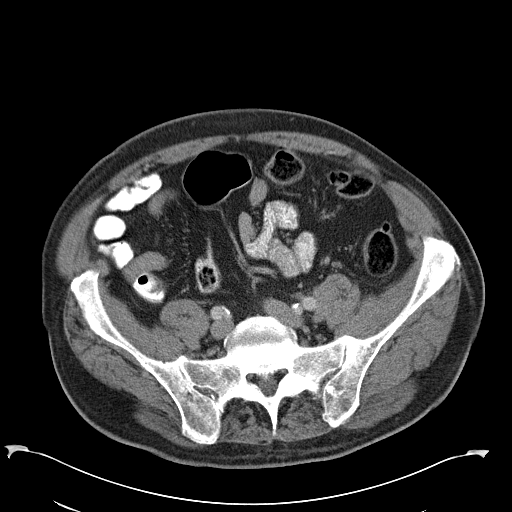
[im 43/98  soft-tissue]
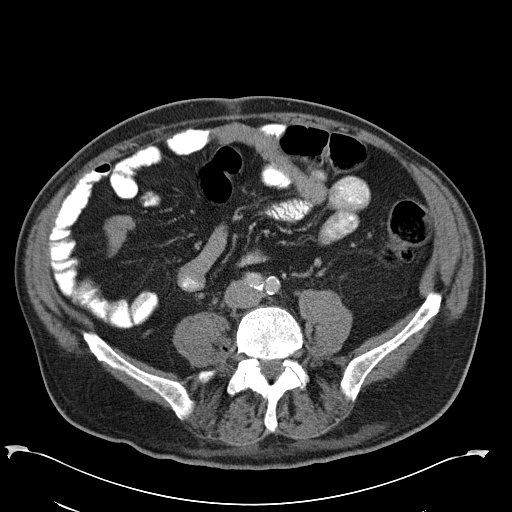
[im 51/98  soft-tissue]
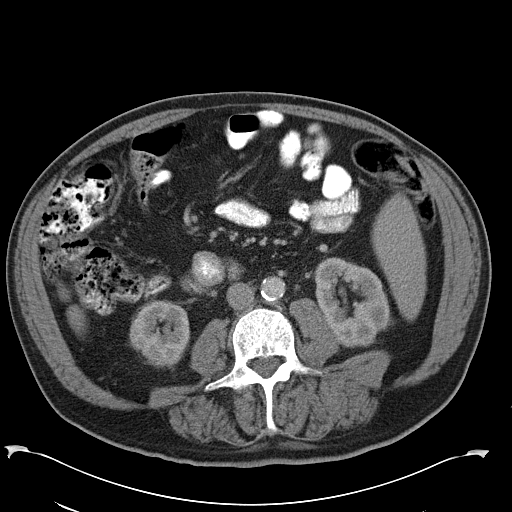
[im 55/98  soft-tissue]
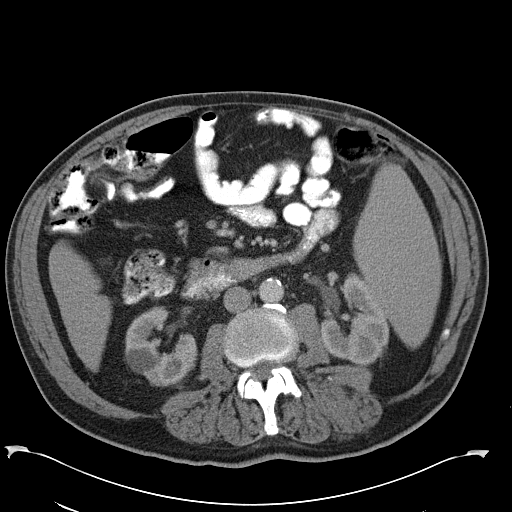
[im 64/98  soft-tissue]
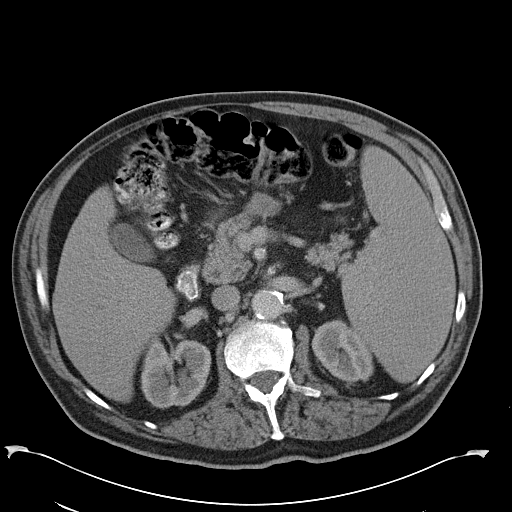
[im 64/98  bone]
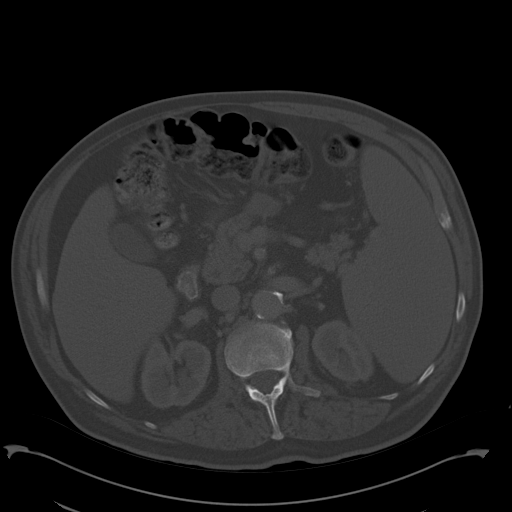
[im 72/98  soft-tissue]
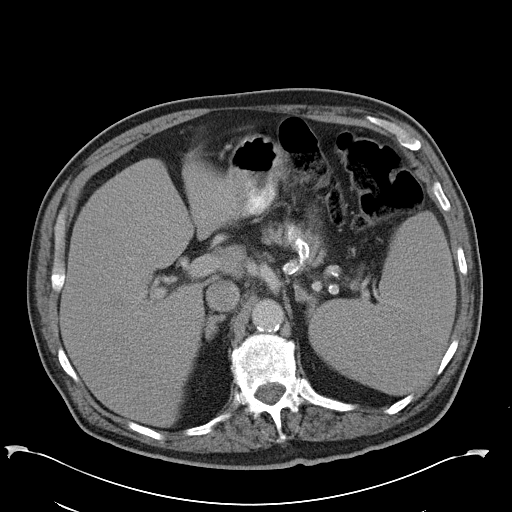
[im 76/98  soft-tissue]
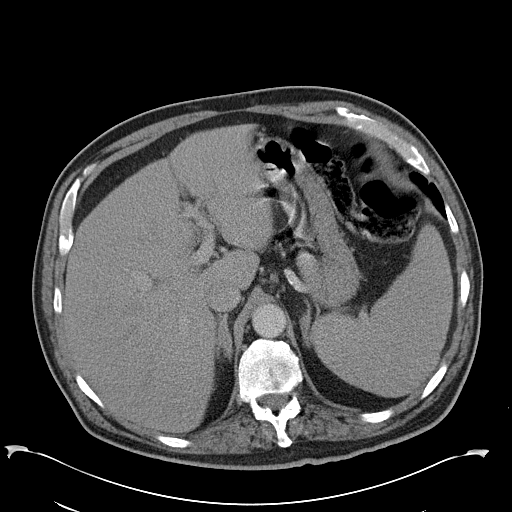
[im 85/98  soft-tissue]
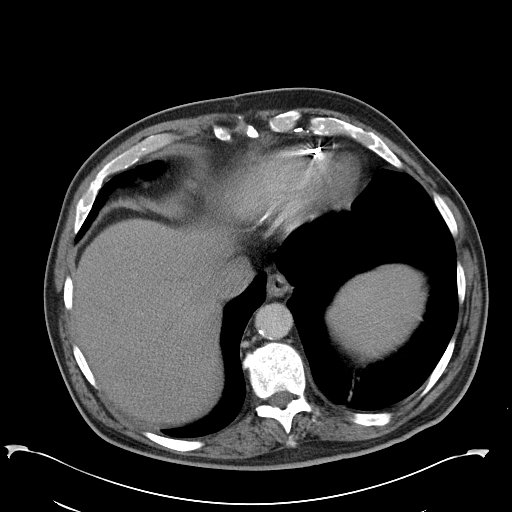
[im 93/98  soft-tissue]
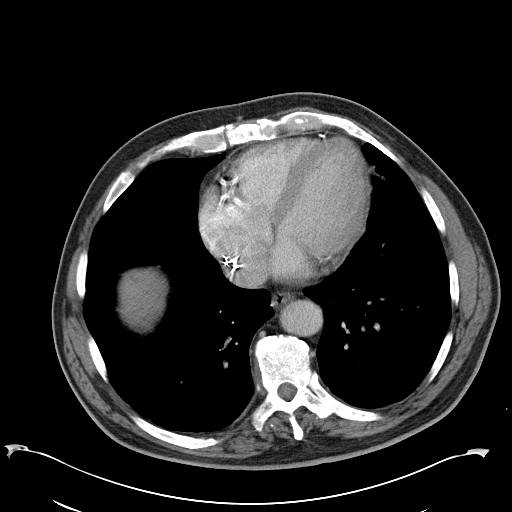

[Series 602: <mpr thick range> · coronal · 0.96mm/px · 3 of 111 slices shown]
[im 37/111  soft-tissue]
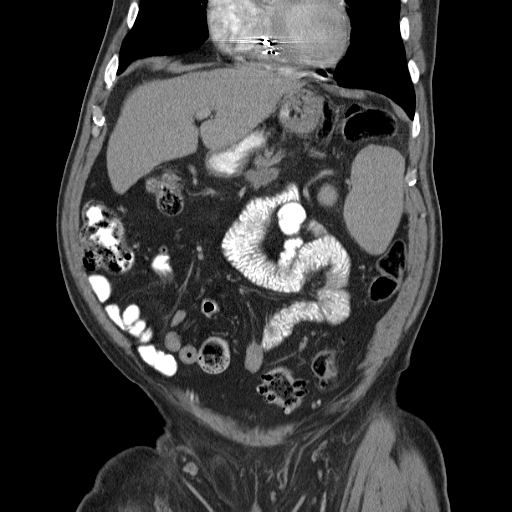
[im 49/111  soft-tissue]
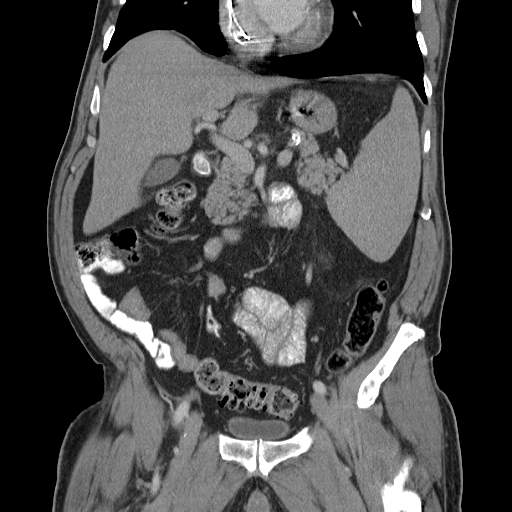
[im 62/111  soft-tissue]
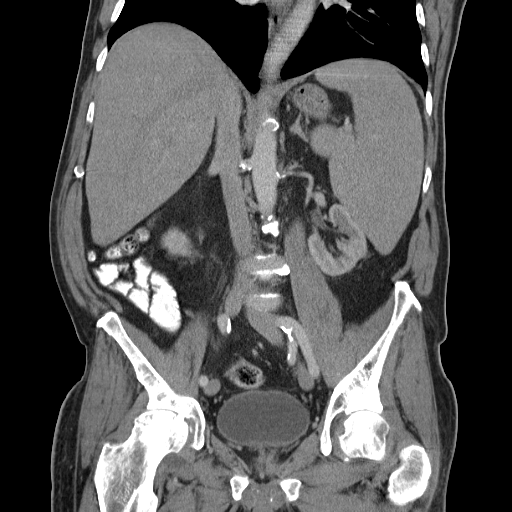

[16 of 46 positions shown; findings below may reference images not displayed]

FINDINGS: Sagittal images of the spine shows degenerative changes
thoracolumbar spine.  Significant disc space flattening with vacuum
disc phenomenon at L4-L5 level.

Lung bases are unremarkable.  Small hiatal hernia.  Enhanced liver
shows no biliary ductal dilatation.  Small accessory splenule is
noted.  Splenomegaly is noted with spleen measuring 21 cm in
length.  Again noted calcified gallstone in the gallbladder fundal
region measures 6 mm.

Again noted mid mesenteric lesion just anterior to the pancreas
measures 3.4 by 2.2 cm.  This is stable in size and appearance from
prior exam.  Atherosclerotic calcifications of the abdominal aorta
and splenic artery again noted.  Atherosclerotic calcifications of
the iliac arteries.  No aortic aneurysm.  Scattered left colon
diverticula.  No evidence of acute diverticulitis.  Multiple
sigmoid colon diverticula.  No evidence of acute diverticulitis.

The pancreas and adrenal glands are unremarkable.

No small bowel obstruction.  No ascites or free air.  Stool noted
within cecum.  The terminal ileum is unremarkable.  No pericecal
inflammation.  Normal appendix is clearly visualized in axial image
49.

Kidneys are symmetrical in size and enhancement.  Delayed renal
images shows bilateral renal symmetrical excretion.  There is a
cyst in the lower pole of the right kidney measures 2.6 cm.  A
smaller cyst in lower pole of the right kidney measures 1 cm.
Bilateral visualized proximal ureter is unremarkable.

There is mild enlarged prostate gland with indentation of urinary
bladder base.  Prostate gland measures 4.1 x 4.7 cm in diameter.
Stool noted in the rectosigmoid colon.  No inguinal adenopathy.
Postsurgical changes are noted right inguinal region.  Small
bilateral inguinal scrotal canal hernia containing fat without
evidence of acute complication.
IMPRESSION: 1.  Stable in size and appearance mid mesenteric mass just anterior
to the pancreas.
2.  Splenomegaly again noted.
3.  No hydronephrosis or hydroureter.  Right renal cysts are noted.
4.  No small bowel obstruction.
5.  Distal colonic diverticula.  No evidence of acute
diverticulitis.
6.  No pericecal inflammation.  Normal appendix.

## 2015-04-30 ENCOUNTER — Ambulatory Visit: Payer: Medicare Other | Admitting: Oncology

## 2017-02-19 DEATH — deceased
